# Patient Record
Sex: Female | Born: 1995
Health system: Southern US, Community
[De-identification: ages and names within clinical notes are randomized; demographics above are authoritative.]

## PROBLEM LIST (undated history)

## (undated) DIAGNOSIS — F32A Depression, unspecified: Secondary | ICD-10-CM

## (undated) DIAGNOSIS — T7840XA Allergy, unspecified, initial encounter: Secondary | ICD-10-CM

## (undated) DIAGNOSIS — F419 Anxiety disorder, unspecified: Secondary | ICD-10-CM

## (undated) HISTORY — DX: Depression, unspecified: F32.A

## (undated) HISTORY — DX: Anxiety disorder, unspecified: F41.9

## (undated) HISTORY — DX: Allergy, unspecified, initial encounter: T78.40XA

---
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
## 2020-12-03 DIAGNOSIS — J0391 Acute recurrent tonsillitis, unspecified: Secondary | ICD-10-CM | POA: Insufficient documentation

## 2021-09-22 ENCOUNTER — Encounter: Payer: Self-pay | Admitting: Physician Assistant

## 2021-09-22 ENCOUNTER — Ambulatory Visit: Payer: BC Managed Care – PPO | Admitting: Physician Assistant

## 2021-09-22 ENCOUNTER — Telehealth: Payer: Self-pay | Admitting: *Deleted

## 2021-09-22 VITALS — BP 110/80 | HR 70 | Temp 98.3°F | Ht 66.0 in | Wt 249.4 lb

## 2021-09-22 DIAGNOSIS — Z124 Encounter for screening for malignant neoplasm of cervix: Secondary | ICD-10-CM

## 2021-09-22 DIAGNOSIS — F411 Generalized anxiety disorder: Secondary | ICD-10-CM | POA: Diagnosis not present

## 2021-09-22 DIAGNOSIS — R079 Chest pain, unspecified: Secondary | ICD-10-CM

## 2021-09-22 DIAGNOSIS — Z309 Encounter for contraceptive management, unspecified: Secondary | ICD-10-CM | POA: Diagnosis not present

## 2021-09-22 DIAGNOSIS — E559 Vitamin D deficiency, unspecified: Secondary | ICD-10-CM | POA: Diagnosis not present

## 2021-09-22 DIAGNOSIS — F320 Major depressive disorder, single episode, mild: Secondary | ICD-10-CM | POA: Diagnosis not present

## 2021-09-22 LAB — CBC WITH DIFFERENTIAL/PLATELET
Basophils Absolute: 0 10*3/uL (ref 0.0–0.1)
Basophils Relative: 0.6 % (ref 0.0–3.0)
Eosinophils Absolute: 0.1 10*3/uL (ref 0.0–0.7)
Eosinophils Relative: 2.6 % (ref 0.0–5.0)
HCT: 37.9 % (ref 36.0–46.0)
Hemoglobin: 12.2 g/dL (ref 12.0–15.0)
Lymphocytes Relative: 37.7 % (ref 12.0–46.0)
Lymphs Abs: 2.1 10*3/uL (ref 0.7–4.0)
MCHC: 32.2 g/dL (ref 30.0–36.0)
MCV: 73.3 fl — ABNORMAL LOW (ref 78.0–100.0)
Monocytes Absolute: 0.3 10*3/uL (ref 0.1–1.0)
Monocytes Relative: 5.9 % (ref 3.0–12.0)
Neutro Abs: 3 10*3/uL (ref 1.4–7.7)
Neutrophils Relative %: 53.2 % (ref 43.0–77.0)
Platelets: 195 10*3/uL (ref 150.0–400.0)
RBC: 5.17 Mil/uL — ABNORMAL HIGH (ref 3.87–5.11)
RDW: 15.5 % (ref 11.5–15.5)
WBC: 5.6 10*3/uL (ref 4.0–10.5)

## 2021-09-22 LAB — COMPREHENSIVE METABOLIC PANEL
ALT: 18 U/L (ref 0–35)
AST: 16 U/L (ref 0–37)
Albumin: 4.3 g/dL (ref 3.5–5.2)
Alkaline Phosphatase: 68 U/L (ref 39–117)
BUN: 13 mg/dL (ref 6–23)
CO2: 27 mEq/L (ref 19–32)
Calcium: 9.3 mg/dL (ref 8.4–10.5)
Chloride: 105 mEq/L (ref 96–112)
Creatinine, Ser: 0.84 mg/dL (ref 0.40–1.20)
GFR: 97.96 mL/min (ref >60.00)
Glucose, Bld: 89 mg/dL (ref 70–99)
Potassium: 4.3 mEq/L (ref 3.5–5.1)
Sodium: 140 mEq/L (ref 135–145)
Total Bilirubin: 0.5 mg/dL (ref 0.2–1.2)
Total Protein: 7.3 g/dL (ref 6.0–8.3)

## 2021-09-22 LAB — TSH: TSH: 1.85 u[IU]/mL (ref 0.35–5.50)

## 2021-09-22 LAB — VITAMIN D 25 HYDROXY (VIT D DEFICIENCY, FRACTURES): VITD: 22.77 ng/mL — ABNORMAL LOW (ref 30.00–100.00)

## 2021-09-22 MED ORDER — ESCITALOPRAM OXALATE 10 MG PO TABS: 10.0000 mg | ORAL_TABLET | Freq: Every day | ORAL | 1 refills | Status: DC

## 2021-09-22 MED ORDER — NORGESTIM-ETH ESTRAD TRIPHASIC 0.18/0.215/0.25 MG-35 MCG PO TABS: 1.0000 | ORAL_TABLET | Freq: Every day | ORAL | 1 refills | Status: DC

## 2021-09-22 NOTE — Telephone Encounter (Signed)
Pt called back told her Rx's were sent to her pharmacy for her and referral has been placed for Gyn, someone will contact you to schedule an appt. ?

## 2021-09-22 NOTE — Patient Instructions (Signed)
It was great to see you! ? ?Start lexapro 10 mg daily ? ?Gynecology referral placed ? ?Start Tri-Sprintec birth control pills ? ?We will update blood work today ? ?If chest pain does not improve or worsens in any way, please let us know ? ?Take care, ? ?Jarold Motto PA-C  ?

## 2021-09-22 NOTE — Telephone Encounter (Signed)
Left message on voicemail to call office.  

## 2021-09-22 NOTE — Progress Notes (Signed)
Yvonne Odom is a 26 y.o. female here for a follow up of a pre-existing problem. ? ?History of Present Illness:  ? ?Chief Complaint  ?Patient presents with  ? Anxiety  ? Depression  ?  Pt c/o increase in anxiety for the past 3 months. She was Zoloft but stopped a year ago.  ? Contraception  ?  Pt would like to discuss birth control options.  ? ? ?HPI ? ?Anxiety ?Patient here with worsening anxiety. Symptoms started to get worse for the past 3 months. She currently moved from [place] to [place] about 2 months ago. 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 She is getting married in few months and thinks this could be contributing. She notes she was taking Zoloft for 2 years in the past. This has not helped. She started to get more anxious so she stopped taking Zoloft about a year ago. She was on Zoloft 100 mg in the past. She was tolerating her medication well. No other side effects.  ? ?She notes that she has been having persistent chest pain. She thinks this is related to her anxiety. She described this as "pressure and dull" pain. Comes and goes. Usually last about couple of minutes. She has participated in talk therapy for few months. Her mood has been better. However, her anxiety seems to be worsening. She has been having hard time sleeping at night. She has never tried any other medication other than Zoloft for her anxiety. She would like to try alternative for her symptoms. No SI/HI, LE swelling, n/v/d, diaphoresis. ? ?Birth Control ?Patient present with desire to be started on birth control. She is getting married in few months and would like to have no kids at this time. She notes her periods are normal. However, she has had worsening cramps. Her period are usually heavier during the months. She has noticed some changes in her cycle. She notes her period are usually early sometimes and lighter. Denies nausea or vomiting or prior hx of bleeding/clotting issues. Denies any mood swings.   ? ?Vit D deficiency ?Hx of this, takes supplement regularly. Would like this checked today. ? ?Past Medical History:  ?Diagnosis Date  ? Allergy   ? Anxiety   ? Depression   ? ?  ?Social History  ? ?Tobacco Use  ? Smoking status: Never  ? Smokeless tobacco: Never  ?Vaping Use  ? Vaping Use: Former  ? Quit date: 05/22/2020  ?Substance Use Topics  ? Alcohol use: Yes  ?  Alcohol/week: 2.0 - 3.0 standard drinks  ?  Types: 2 - 3 Glasses of wine per week  ? Drug use: Never  ? ? ?History reviewed. No pertinent surgical history. ? ?Family History  ?Problem Relation Age of Onset  ? Alcohol abuse Father   ? Depression Sister   ? Depression Sister   ? Hyperlipidemia Maternal Grandmother   ? Diabetes Maternal Grandmother   ? Hypertension Maternal Grandfather   ? Hypertension Paternal Grandmother   ? Diabetes Paternal Grandmother   ? Kidney disease Paternal Grandmother   ? Breast cancer Neg Hx   ? Colon cancer Neg Hx   ? ? ?No Known Allergies ? ?Current Medications:  ? ?Current Outpatient Medications:  ?  Cholecalciferol (VITAMIN D) 125 MCG (5000 UT) CAPS, Take 1 capsule by mouth daily in the afternoon., Disp: , Rfl:  ?  escitalopram (LEXAPRO) 10 MG tablet, Take 1 tablet (10 mg total) by mouth daily., Disp: 30 tablet, Rfl: 1 ?  Multiple Vitamins-Minerals (MULTI-VITAMIN GUMMIES) CHEW, Chew 2 each by mouth daily in the afternoon., Disp: , Rfl:  ?  Norgestimate-Ethinyl Estradiol Triphasic (TRI-SPRINTEC) 0.18/0.215/0.25 MG-35 MCG tablet, Take 1 tablet by mouth daily., Disp: 84 tablet, Rfl: 1  ? ?Review of Systems:  ? ?ROS ?Negative unless otherwise specified per HPI.  ? ?Vitals:  ? ?Vitals:  ? 09/22/21 0811  ?BP: 110/80  ?Pulse: 70  ?Temp: 98.3 ?F (36.8 ?C)  ?TempSrc: Temporal  ?SpO2: 97%  ?Weight: 249 lb 6.1 oz (113.1 kg)  ?Height: 5\' 6"  (1.676 m)  ?   ?Body mass index is 40.25 kg/m?. ? ?Physical Exam:  ? ?Physical Exam ?Vitals and nursing note reviewed.  ?Constitutional:   ?   General: She is not in acute distress. ?   Appearance:  She is well-developed. She is not ill-appearing or toxic-appearing.  ?Cardiovascular:  ?   Rate and Rhythm: Normal rate and regular rhythm.  ?   Pulses: Normal pulses.  ?   Heart sounds: Normal heart sounds, S1 normal and S2 normal.  ?Pulmonary:  ?   Effort: Pulmonary effort is normal.  ?   Breath sounds: Normal breath sounds.  ?Skin: ?   General: Skin is warm and dry.  ?Neurological:  ?   Mental Status: She is alert.  ?   GCS: GCS eye subscore is 4. GCS verbal subscore is 5. GCS motor subscore is 6.  ?Psychiatric:     ?   Speech: Speech normal.     ?   Behavior: Behavior normal. Behavior is cooperative.  ? ? ?Assessment and Plan:  ? ?GAD (generalized anxiety disorder); Depression, major, single episode, mild (HCC) ?Uncontrolled ?Denies SI/HI ?Start lexapro 10 mg daily ?Follow-up in 1 month, sooner if concerns ?I discussed with patient that if they develop any SI, to tell someone immediately and seek medical attention. ? ?Encounter for contraceptive management, unspecified type ?Options discussed ?She would like to trial OCP ?Start tri-sprintec -- discussed side effects/risks/benefits ?She is currently not sexually active ?Follow-up with gyn -- referral placed ? ?Vitamin D deficiency ?Update Vit D and provide recommendations accordingly ? ?Pap smear for cervical cancer screening ?Gyn referral ? ?Chest pain, unspecified type ?New ?Offered EKG but she declined ?Does not sound cardiac in nature, she would like to wait to see if this recurs that we further work-up ?Continue to monitor -- if new sx or any worsening, needs to go to ER ? ?I,Savera Zaman,acting as a for Neurosurgeon, PA.,have documented all relevant documentation on the behalf of [employer], PA,as directed by  Jarold Motto, PA while in the presence of Jarold Motto, Jarold Motto.  ? ?[place], PA, have reviewed all documentation for this visit. The documentation on 09/22/21 for the exam, diagnosis, procedures, and orders are all accurate  and complete. ? ? ?11/22/21, PA-C ? ?

## 2021-09-23 ENCOUNTER — Other Ambulatory Visit: Payer: Self-pay | Admitting: Physician Assistant

## 2021-09-23 MED ORDER — VITAMIN D (ERGOCALCIFEROL) 1.25 MG (50000 UNIT) PO CAPS: 50000.0000 [IU] | ORAL_CAPSULE | ORAL | 0 refills | Status: DC

## 2021-11-25 ENCOUNTER — Encounter: Payer: Self-pay | Admitting: Physician Assistant

## 2021-11-25 ENCOUNTER — Ambulatory Visit: Payer: BC Managed Care – PPO | Admitting: Physician Assistant

## 2021-11-25 VITALS — BP 110/80 | HR 62 | Temp 98.0°F | Ht 66.0 in | Wt 253.0 lb

## 2021-11-25 DIAGNOSIS — F411 Generalized anxiety disorder: Secondary | ICD-10-CM | POA: Diagnosis not present

## 2021-11-25 DIAGNOSIS — F320 Major depressive disorder, single episode, mild: Secondary | ICD-10-CM | POA: Diagnosis not present

## 2021-11-25 DIAGNOSIS — Z309 Encounter for contraceptive management, unspecified: Secondary | ICD-10-CM | POA: Diagnosis not present

## 2021-11-25 MED ORDER — ESCITALOPRAM OXALATE 20 MG PO TABS: 20.0000 mg | ORAL_TABLET | Freq: Every day | ORAL | 3 refills | Status: DC

## 2021-11-25 NOTE — Progress Notes (Signed)
Yvonne Odom is a 26 y.o. female here for a follow up on anxiety.   History of Present Illness:   Chief Complaint  Patient presents with   Anxiety    HPI  Anxiety/Depression  Patient here for 1 month follow-up. She is currently taking Lexapro 10 mg daily with no complications. She is compliant with her medication. Has had some nausea but she also started her birth control at this time. Tolerating her medication with no other adverse side effects. Has had increased anxiety in the previous visit but this has improved. Per pt, chest pain has been improving as well. She has found this regimen to be beneficial for her but feels like she needs higher dose. Denies any worsening anxiety sx. No SI/HI.   Contraception management She is currently taking tri-sprintec with no adverse side effects. At this time, she is managing well. Denies any concerning sx.   Past Medical History:  Diagnosis Date   Allergy    Anxiety    Depression      Social History   Tobacco Use   Smoking status: Never   Smokeless tobacco: Never  Vaping Use   Vaping Use: Former   Quit date: 05/22/2020  Substance Use Topics   Alcohol use: Yes    Alcohol/week: 2.0 - 3.0 standard drinks of alcohol    Types: 2 - 3 Glasses of wine per week   Drug use: Never    History reviewed. No pertinent surgical history.  Family History  Problem Relation Age of Onset   Alcohol abuse Father    Depression Sister    Depression Sister    Hyperlipidemia Maternal Grandmother    Diabetes Maternal Grandmother    Hypertension Maternal Grandfather    Hypertension Paternal Grandmother    Diabetes Paternal Grandmother    Kidney disease Paternal Grandmother    Breast cancer Neg Hx    Colon cancer Neg Hx     No Known Allergies  Current Medications:   Current Outpatient Medications:    Cholecalciferol (VITAMIN D) 125 MCG (5000 UT) CAPS, Take 1 capsule by mouth daily in the afternoon., Disp: , Rfl:    escitalopram (LEXAPRO) 10  MG tablet, Take 1 tablet (10 mg total) by mouth daily., Disp: 30 tablet, Rfl: 1   Multiple Vitamins-Minerals (MULTI-VITAMIN GUMMIES) CHEW, Chew 2 each by mouth daily in the afternoon., Disp: , Rfl:    Norgestimate-Ethinyl Estradiol Triphasic (TRI-SPRINTEC) 0.18/0.215/0.25 MG-35 MCG tablet, Take 1 tablet by mouth daily., Disp: 84 tablet, Rfl: 1   Vitamin D, Ergocalciferol, (DRISDOL) 1.25 MG (50000 UNIT) CAPS capsule, Take 1 capsule (50,000 Units total) by mouth every 7 (seven) days., Disp: 12 capsule, Rfl: 0   Review of Systems:   ROS Negative unless otherwise specified per HPI.   Vitals:   Vitals:   11/25/21 1007  BP: 110/80  Pulse: 62  Temp: 98 F (36.7 C)  TempSrc: Temporal  SpO2: 98%  Weight: 253 lb (114.8 kg)  Height: 5\' 6"  (1.676 m)     Body mass index is 40.84 kg/m.  Physical Exam:   Physical Exam Vitals and nursing note reviewed.  Constitutional:      General: She is not in acute distress.    Appearance: She is well-developed. She is not ill-appearing or toxic-appearing.  Cardiovascular:     Rate and Rhythm: Normal rate and regular rhythm.     Pulses: Normal pulses.     Heart sounds: Normal heart sounds, S1 normal and S2 normal.  Pulmonary:  Effort: Pulmonary effort is normal.     Breath sounds: Normal breath sounds.  Skin:    General: Skin is warm and dry.  Neurological:     Mental Status: She is alert.     GCS: GCS eye subscore is 4. GCS verbal subscore is 5. GCS motor subscore is 6.  Psychiatric:        Speech: Speech normal.        Behavior: Behavior normal. Behavior is cooperative.     Assessment and Plan:   GAD (generalized anxiety disorder); Depression, major, single episode, mild (HCC) Improving Increase Lexapro to 20 mg daily Follow-up in 6 months for CPE, sooner if concerns  Encounter for contraceptive management, unspecified type Doing well with Tri-Sprintec -- denies any significant concerns Continue medications at this time Follow-up  in 6 months for CPE, sooner if concerns  I,Savera Zaman,acting as a scribe for [employer], PA.,have documented all relevant documentation on the behalf of Jarold Motto, PA,as directed by  Jarold Motto, PA while in the presence of Jarold Motto, [place].   I, Jarold Motto, [place], have reviewed all documentation for this visit. The documentation on 11/25/21 for the exam, diagnosis, procedures, and orders are all accurate and complete.   Jarold Motto, PA-C

## 2021-11-25 NOTE — Patient Instructions (Signed)
Follow up in 6 months 

## 2021-12-06 ENCOUNTER — Encounter: Payer: Self-pay | Admitting: Nurse Practitioner

## 2021-12-06 ENCOUNTER — Other Ambulatory Visit (HOSPITAL_COMMUNITY)
Admission: RE | Admit: 2021-12-06 | Discharge: 2021-12-06 | Disposition: A | Discharge status: Home / Self Care | Payer: BC Managed Care – PPO | Source: Ambulatory Visit | Attending: Nurse Practitioner | Admitting: Nurse Practitioner

## 2021-12-06 ENCOUNTER — Ambulatory Visit (INDEPENDENT_AMBULATORY_CARE_PROVIDER_SITE_OTHER): Payer: BC Managed Care – PPO | Admitting: Nurse Practitioner

## 2021-12-06 VITALS — BP 130/86 | HR 66 | Resp 14 | Ht 66.0 in | Wt 255.0 lb

## 2021-12-06 DIAGNOSIS — Z01419 Encounter for gynecological examination (general) (routine) without abnormal findings: Secondary | ICD-10-CM | POA: Insufficient documentation

## 2021-12-06 DIAGNOSIS — Z3041 Encounter for surveillance of contraceptive pills: Secondary | ICD-10-CM

## 2021-12-06 NOTE — Progress Notes (Signed)
   Yvonne Odom 1996/01/16 [id number]   History:  26 y.o. G0 presents as new patient to establish care. Monthly cycles. On COCs. Anxiety, depression managed by PCP. Thinks she had Gardasil series.   Gynecologic History Patient's last menstrual period was 11/14/2021 (exact date). Period Cycle (Days): 28 Period Duration (Days): 3-4 Period Pattern: Regular Menstrual Flow: Moderate Menstrual Control: Tampon Dysmenorrhea: (!) Mild Dysmenorrhea Symptoms: Cramping, Other (Comment) (back pain) Contraception/Family planning: OCP (estrogen/progesterone) Sexually active: Yes  Health Maintenance Last Pap: Never Last mammogram: Not indicated Last colonoscopy: Not indicated Last Dexa: Not indicated  Past medical history, past surgical history, family history and social history were all reviewed and documented in the EPIC chart. Married one month ago. Just finished masters. 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   ROS:  A ROS was performed and pertinent positives and negatives are included.  Exam:  Vitals:   12/06/21 1439  BP: 130/86  Pulse: 66  Resp: 14  Weight: 255 lb (115.7 kg)  Height: 5\' 6"  (1.676 m)   Body mass index is 41.16 kg/m.  General appearance:  Normal Thyroid:  Symmetrical, normal in size, without palpable masses or nodularity. Respiratory  Auscultation:  Clear without wheezing or rhonchi Cardiovascular  Auscultation:  Regular rate, without rubs, murmurs or gallops  Edema/varicosities:  Not grossly evident Abdominal  Soft,nontender, without masses, guarding or rebound.  Liver/spleen:  No organomegaly noted  Hernia:  None appreciated  Skin  Inspection:  Grossly normal Breasts: Not indicated per guidelines Genitourinary   Inguinal/mons:  Normal without inguinal adenopathy  External genitalia:  Normal appearing vulva with no masses, tenderness, or lesions  BUS/Urethra/Skene's glands:  Normal  Vagina:  Normal appearing with normal color and  discharge, no lesions  Cervix:  Normal appearing without discharge or lesions  Uterus:  Normal in size, shape and contour.  Midline and mobile, nontender  Adnexa/parametria:     Rt: Normal in size, without masses or tenderness.   Lt: Normal in size, without masses or tenderness.  Anus and perineum: Normal  Digital rectal exam: Not indicated  Patient informed chaperone available to be present for breast and pelvic exam. Patient has requested no chaperone to be present. Patient has been advised what will be completed during breast and pelvic exam.   Assessment/Plan:  26 y.o. G0 to establish care.   Well female exam with routine gynecological exam - Plan: Cytology - PAP( [facility]). Education provided on SBEs, importance of preventative screenings, current guidelines, high calcium diet, regular exercise, and multivitamin daily. Has PCP.   Encounter for surveillance of contraceptive pills - COCs. Doing well on these and taking as prescribed. Provided by PCP.   Screening for cervical cancer - Initial pap today.   Return in 1 year for annual.     30 DNP, 3:10 PM 12/06/2021

## 2021-12-08 LAB — CYTOLOGY - PAP

## 2022-01-04 ENCOUNTER — Encounter: Payer: Self-pay | Admitting: Physician Assistant

## 2022-01-04 ENCOUNTER — Ambulatory Visit (INDEPENDENT_AMBULATORY_CARE_PROVIDER_SITE_OTHER): Payer: BC Managed Care – PPO | Admitting: Physician Assistant

## 2022-01-04 VITALS — BP 110/70 | HR 72 | Temp 97.7°F | Ht 66.0 in | Wt 255.0 lb

## 2022-01-04 DIAGNOSIS — L304 Erythema intertrigo: Secondary | ICD-10-CM

## 2022-01-04 DIAGNOSIS — R110 Nausea: Secondary | ICD-10-CM

## 2022-01-04 DIAGNOSIS — G43009 Migraine without aura, not intractable, without status migrainosus: Secondary | ICD-10-CM

## 2022-01-04 DIAGNOSIS — F411 Generalized anxiety disorder: Secondary | ICD-10-CM

## 2022-01-04 DIAGNOSIS — G4700 Insomnia, unspecified: Secondary | ICD-10-CM

## 2022-01-04 DIAGNOSIS — R5383 Other fatigue: Secondary | ICD-10-CM | POA: Diagnosis not present

## 2022-01-04 MED ORDER — ESCITALOPRAM OXALATE 10 MG PO TABS: 15.0000 mg | ORAL_TABLET | Freq: Every day | ORAL | 3 refills | Status: DC

## 2022-01-04 MED ORDER — NYSTATIN 100000 UNIT/GM EX OINT: TOPICAL_OINTMENT | CUTANEOUS | 0 refills | Status: DC

## 2022-01-04 MED ORDER — RIZATRIPTAN BENZOATE 5 MG PO TABS: 5.0000 mg | ORAL_TABLET | ORAL | 0 refills | Status: DC | PRN

## 2022-01-04 MED ORDER — ONDANSETRON HCL 4 MG PO TABS: 4.0000 mg | ORAL_TABLET | Freq: Three times a day (TID) | ORAL | 1 refills | Status: DC | PRN

## 2022-01-04 NOTE — Patient Instructions (Addendum)
It was great to see you! Let's follow-up in 3 months to check in on all of these topics  Stop birth control. Message me if nausea does not improve with this. I have sent in zofran for nausea.  Ask your partner if they feel as though you have sleep apnea -- severe snoring or waking up gasping for air.  Start 5 mg maxalt to use as needed for your migraines -- see below.   Cream for breast rash.   Decrease lexapro to 15 mg daily.  Work on sleep hygiene - see below  Migraine Recommendations: 1.  Start maxalt as needed.  Call in 4 weeks with update and we can adjust dose if needed. 2.  Take maxalt at earliest onset of headache.  May repeat dose once in 2 hours if needed.  Do not exceed two tablets in 24 hours. 3.  Limit use of pain relievers to no more than 2 days out of the week.  These medications include acetaminophen, ibuprofen, triptans and narcotics.  This will help reduce risk of rebound headaches. 4.  Be aware of common food triggers such as processed sweets, processed foods with nitrites (such as deli meat, hot dogs, sausages), foods with MSG, alcohol (such as wine), chocolate, certain cheeses, certain fruits (dried fruits, bananas, pineapple), vinegar, diet soda. 4.  Avoid caffeine 5.  Routine exercise 6.  Proper sleep hygiene 7.  Stay adequately hydrated with water 8.  Keep a headache diary. 9.  Maintain proper stress management. 10.  Do not skip meals. 11.  Consider supplements:  Magnesium citrate 400mg  to 600mg  daily, riboflavin 400mg , Coenzyme Q 10 100mg  three times daily  Sleep Hygiene  Do: (1) Go to bed at the same time each day. (2) Get up from bed at the same time each day. (3) Get regular exercise each day, preferably in the morning.  There is goof evidence that regular exercise improves restful sleep.  This includes stretching and aerobic exercise. (4) Get regular exposure to outdoor or bright lights, especially in the late afternoon. (5) Keep the temperature in your  bedroom comfortable. (6) Keep the bedroom quiet when sleeping. (7) Keep the bedroom dark enough to facilitate sleep. (8) Use your bed only for sleep and sex. (9) Take medications as directed.  It is helpful to take prescribed sleeping pills 1 hour before bedtime, so they are causing drowsiness when you lie down, or 10 hours before getting up, to avoid daytime drowsiness. (10) Use a relaxation exercise just before going to sleep -- imagery, massage, warm bath. (11) Keep your feet and hands warm.  Wear warm socks and/or mittens or gloves to bed.  Don't: (1) Exercise just before going to bed. (2) Engage in stimulating activity just before bed, such as playing a competitive game, watching an exciting program on television, or having an important discussion with a loved one. (3) Have caffeine in the evening (coffee, teas, chocolate, sodas, etc.) (4) Read or watch television in bed. (5) Use alcohol to help you sleep. (6) Go to bed too hungry or too full. (7) Take another person's sleeping pills. (8) Take over-the-counter sleeping pills, without your doctor's knowledge.  Tolerance can develop rapidly with these medications.  Diphenhydramine can have serious side effects for elderly patients. (9) Take daytime naps. (10) Command yourself to go to sleep.  This only makes your mind and body more alert.  If you lie awake for more than 20-30 minutes, get up, go to a different room, participate in  a quiet activity (Ex - non-excitable reading or television), and then return to bed when you feel sleepy.  Do this as many times during the night as needed.  This may cause you to have a night or two of poor sleep but it will train your brain to know when it is time for sleep.

## 2022-01-04 NOTE — Progress Notes (Signed)
Yvonne Odom is a 26 y.o. female here for a follow up of a pre-existing problem.  History of Present Illness:   Chief Complaint  Patient presents with   Migraine    Pt c/o Migraines have been worse past couple of months, last migraine was 2 days ago. Migraine are usually on the left side. Also c/o left eye twitching off and on x 2 months. Takes Ibuprofen and Tylenol.    HPI  Migraines Has had migraines since a young child. They were overall well controlled up until a few months ago - they are currently occurring weekly or every other week. Takes 3-4 ibuprofen at at a time for her migraines. This is sometimes effective for her. Would like possible abortive medication for this.  She has never been on an abortive prescription.  She is concerned that her migraines could be worsened because of her birth control.  She denies any visual or sensory auras at this time.  Denies any numbness, tingling, worst headache of life, vision changes.  Nausea Intermittent x 1 year. No vomiting. Will typically last a few hours. Has never taken anything for this in the past.  Occurs most days. Thinks that maybe her birth control is contributing to this.  Fatigue; insomnia She is dealing with this all the time.  She states that she is not sleeping well and thinks that this could be the culprit.  She has no issues falling asleep but she is waking up sometimes 2-5 times in the middle of the night.  This morning she woke up at 3 and was up for the day.  When she wakes up in the middle the night she typically looks at her phone.  She does not suspect that she has sleep apnea.  She is stressed with starting up school soon.  Anxiety She feels like maybe she needs to decrease her Lexapro.  She states that the 10 mg of Lexapro was not effective so she is interested in trialing 15 mg.  She feels like she is a little bit keyed up with the current dose of Lexapro.  Denies SI/HI.  Rash Under bilateral breasts and near  sternum she has what she feels is a heat rash.  Can be itchy and painful at times.  She has not put anything on it.  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  Area has not opened or drained.   Past Medical History:  Diagnosis Date   Allergy    Anxiety    Depression      Social History   Tobacco Use   Smoking status: Never    Passive exposure: Never   Smokeless tobacco: Never  Vaping Use   Vaping Use: Former   Quit date: 05/22/2020  Substance Use Topics   Alcohol use: Yes    Alcohol/week: 2.0 - 3.0 standard drinks of alcohol    Types: 2 - 3 Glasses of wine per week   Drug use: Never    History reviewed. No pertinent surgical history.  Family History  Problem Relation Age of Onset   Alcohol abuse Father    Depression Sister    Depression Sister    Hyperlipidemia Maternal Grandmother    Diabetes Maternal Grandmother    Hypertension Maternal Grandfather    Hypertension Paternal Grandmother    Diabetes Paternal Grandmother    Kidney disease Paternal Grandmother    Breast cancer Neg Hx  Colon cancer Neg Hx     No Known Allergies  Current Medications:   Current Outpatient Medications:    acetaminophen (TYLENOL) 500 MG tablet, Take 1,000 mg by mouth every 6 (six) hours as needed for headache., Disp: , Rfl:    Cholecalciferol (VITAMIN D) 125 MCG (5000 UT) CAPS, Take 1 capsule by mouth daily in the afternoon., Disp: , Rfl:    escitalopram (LEXAPRO) 10 MG tablet, Take 1.5 tablets (15 mg total) by mouth daily., Disp: 45 tablet, Rfl: 3   ibuprofen (ADVIL) 200 MG tablet, Take 600-800 mg by mouth every 6 (six) hours as needed for headache., Disp: , Rfl:    Multiple Vitamins-Minerals (MULTI-VITAMIN GUMMIES) CHEW, Chew 2 each by mouth daily in the afternoon., Disp: , Rfl:    Norgestimate-Ethinyl Estradiol Triphasic (TRI-SPRINTEC) 0.18/0.215/0.25 MG-35 MCG tablet, Take 1 tablet by mouth daily., Disp: 84 tablet, Rfl: 1    nystatin ointment (MYCOSTATIN), Apply to affected area 1-2 times daily, Disp: 30 g, Rfl: 0   ondansetron (ZOFRAN) 4 MG tablet, Take 1 tablet (4 mg total) by mouth every 8 (eight) hours as needed for nausea or vomiting., Disp: 30 tablet, Rfl: 1   rizatriptan (MAXALT) 5 MG tablet, Take 1 tablet (5 mg total) by mouth as needed for migraine. May repeat in 2 hours if needed, Disp: 10 tablet, Rfl: 0   Review of Systems:   ROS Negative unless otherwise specified per HPI.   Vitals:   Vitals:   01/04/22 1309  BP: 110/70  Pulse: 72  Temp: 97.7 F (36.5 C)  TempSrc: Temporal  SpO2: 98%  Weight: 255 lb (115.7 kg)  Height: 5\' 6"  (1.676 m)     Body mass index is 41.16 kg/m.  Physical Exam:   Physical Exam Vitals and nursing note reviewed.  Constitutional:      General: She is not in acute distress.    Appearance: She is well-developed. She is not ill-appearing or toxic-appearing.  Cardiovascular:     Rate and Rhythm: Normal rate and regular rhythm.     Pulses: Normal pulses.     Heart sounds: Normal heart sounds, S1 normal and S2 normal.  Pulmonary:     Effort: Pulmonary effort is normal.     Breath sounds: Normal breath sounds.  Skin:    General: Skin is warm and dry.  Neurological:     Mental Status: She is alert.     GCS: GCS eye subscore is 4. GCS verbal subscore is 5. GCS motor subscore is 6.  Psychiatric:        Speech: Speech normal.        Behavior: Behavior normal. Behavior is cooperative.     Assessment and Plan:   Migraine without aura and without status migrainosus, not intractable No red flags Stop birth control to see if this is contributing  I have also given her Maxalt 5 mg to use for abortive measures I have given her a list of supplements to consider taking Follow-up in 3 months, sooner if concerns  Nausea Unclear etiology We will stop birth control to see if this is contributing I have sent in Zofran to see if this can help her symptoms Follow-up  in 3 months, sooner if concerns  Other fatigue; Insomnia, unspecified type Suspect that this may be related to her recent increase in anxiety as well as poor sleep habits Decrease Lexapro to help mitigate possible side effects that are keeping her awake Discussed sleep hygiene Asked her to ask her  spouse to see if they have concerns for sleep apnea, consider sleep study.  Intertrigo Suspect intertrigo Nystatin provided to apply to area Follow-up if not improving  GAD (generalized anxiety disorder) Uncontrolled Recommend decrease Lexapro to 15 mg daily Follow-up in 3 months, sooner if concerns  Time spent with patient today was 55 minutes which consisted of chart review, discussing diagnosis, work up, treatment answering questions and documentation.   Jarold Motto, PA-C

## 2022-01-13 ENCOUNTER — Ambulatory Visit: Payer: BC Managed Care – PPO | Admitting: Physician Assistant

## 2022-01-22 ENCOUNTER — Telehealth: Payer: BC Managed Care – PPO | Admitting: Nurse Practitioner

## 2022-01-22 DIAGNOSIS — J069 Acute upper respiratory infection, unspecified: Secondary | ICD-10-CM

## 2022-01-22 MED ORDER — FLUTICASONE PROPIONATE 50 MCG/ACT NA SUSP: 2.0000 | Freq: Every day | NASAL | 6 refills | Status: DC

## 2022-01-22 MED ORDER — BENZONATATE 100 MG PO CAPS: 100.0000 mg | ORAL_CAPSULE | Freq: Three times a day (TID) | ORAL | 0 refills | Status: DC | PRN

## 2022-01-22 NOTE — Progress Notes (Signed)
E-Visit for Upper Respiratory Infection   We are sorry you are not feeling well.  Here is how we plan to help!  After reviewing your history and your images it looks like you have a virus. One virus that can cause a sore throat and a cough is COVID, you may want to consider taking a home COVID test.   Another Virus that causes a sore throat with white patches similar to the ones you have is Mono. If you have not had mono in the past and your sore throat persists you may want to consider getting tested for mono. This virus typically causes a sore throat, swollen lymph nodes in the neck and fatigue.  Upper Respiratory Tract Infections: Symptoms vary from person to person, with common symptoms including sore throat, cough, fatigue or lack of energy and feeling of general discomfort.  A low-grade fever of up to 100.4 may present, but is often uncommon.  Symptoms vary however, and are closely related to a person's age or underlying illnesses.  The most common symptoms associated with an upper respiratory infection are nasal discharge or congestion, cough, sneezing, headache and pressure in the ears and face.  These symptoms usually persist for about 3 to 10 days, but can last up to 2 weeks.  It is important to know that upper respiratory infections do not cause serious illness or complications in most cases.    Upper respiratory infections can be transmitted from person to person, with the most common method of transmission being a person's hands.  The virus is able to live on the skin and can infect other persons for up to 2 hours after direct contact.  Also, these can be transmitted when someone coughs or sneezes; thus, it is important to cover the mouth to reduce this risk.  To keep the spread of the illness at bay, good hand hygiene is very important.  This is an infection that is most likely caused by a virus. There are no specific treatments other than to help you with the symptoms until the infection  runs its course.  We are sorry you are not feeling well.  Here is how we plan to help!   For nasal congestion, you may use an oral decongestants such as Mucinex D or if you have glaucoma or high blood pressure use plain Mucinex.  Saline nasal spray or nasal drops can help and can safely be used as often as needed for congestion.  For your congestion, I have prescribed Fluticasone nasal spray one spray in each nostril twice a day  If you do not have a history of heart disease, hypertension, diabetes or thyroid disease, prostate/bladder issues or glaucoma, you may also use Sudafed to treat nasal congestion.  It is highly recommended that you consult with a pharmacist or your primary care physician to ensure this medication is safe for you to take.     If you have a cough, you may use cough suppressants such as Delsym and Robitussin.  If you have glaucoma or high blood pressure, you can also use Coricidin HBP.   For cough I have prescribed for you A prescription cough medication called Tessalon Perles 100 mg. You may take 1-2 capsules every 8 hours as needed for cough  If you have a sore or scratchy throat, use a saltwater gargle-  to  teaspoon of salt dissolved in a 4-ounce to 8-ounce glass of warm water.  Gargle the solution for approximately 15-30 seconds and then spit.  It is important not to swallow the solution.  You can also use throat lozenges/cough drops and Chloraseptic spray to help with throat pain or discomfort.  Warm or cold liquids can also be helpful in relieving throat pain.  For headache, pain or general discomfort, you can use Ibuprofen or Tylenol as directed.   Some authorities believe that zinc sprays or the use of Echinacea may shorten the course of your symptoms.   HOME CARE Only take medications as instructed by your medical team. Be sure to drink plenty of fluids. Water is fine as well as fruit juices, sodas and electrolyte beverages. You may want to stay away from caffeine  or alcohol. If you are nauseated, try taking small sips of liquids. How do you know if you are getting enough fluid? Your urine should be a pale yellow or almost colorless. Get rest. Taking a steamy shower or using a humidifier may help nasal congestion and ease sore throat pain. You can place a towel over your head and breathe in the steam from hot water coming from a faucet. Using a saline nasal spray works much the same way. Cough drops, hard candies and sore throat lozenges may ease your cough. Avoid close contacts especially the very young and the elderly Cover your mouth if you cough or sneeze Always remember to wash your hands.   GET HELP RIGHT AWAY IF: You develop worsening fever. If your symptoms do not improve within 10 days You develop yellow or green discharge from your nose over 3 days. You have coughing fits You develop a severe head ache or visual changes. You develop shortness of breath, difficulty breathing or start having chest pain Your symptoms persist after you have completed your treatment plan  MAKE SURE YOU  Understand these instructions. Will watch your condition. Will get help right away if you are not doing well or get worse.  Thank you for choosing an e-visit.  Your e-visit answers were reviewed by a board certified advanced clinical practitioner to complete your personal care plan. Depending upon the condition, your plan could have included both over the counter or prescription medications.  Please review your pharmacy choice. Make sure the pharmacy is open so you can pick up prescription now. If there is a problem, you may contact your provider through [employer] and have the prescription routed to another pharmacy.  Your safety is important to [place]. If you have drug allergies check your prescription carefully.   For the next 24 hours you can use MyChart to ask questions about today's visit, request a non-urgent call back, or ask for a work or school  excuse. You will get an email in the next two days asking about your experience. I hope that your e-visit has been valuable and will speed your recovery.  Meds ordered this encounter  Medications   fluticasone (FLONASE) 50 MCG/ACT nasal spray    Sig: Place 2 sprays into both nostrils daily.    Dispense:  16 g    Refill:  6   benzonatate (TESSALON) 100 MG capsule    Sig: Take 1 capsule (100 mg total) by mouth 3 (three) times daily as needed.    Dispense:  30 capsule    Refill:  0     I spent approximately 5 minutes reviewing the patient's history, current symptoms and coordinating their plan of care today.

## 2022-01-23 ENCOUNTER — Ambulatory Visit
Admission: EM | Admit: 2022-01-23 | Discharge: 2022-01-23 | Disposition: A | Discharge status: Home / Self Care | Payer: BC Managed Care – PPO | Attending: Emergency Medicine | Admitting: Emergency Medicine

## 2022-01-23 DIAGNOSIS — J029 Acute pharyngitis, unspecified: Secondary | ICD-10-CM | POA: Diagnosis not present

## 2022-01-23 LAB — POCT RAPID STREP A (OFFICE): Rapid Strep A Screen: NEGATIVE

## 2022-01-23 MED ORDER — LIDOCAINE VISCOUS HCL 2 % MT SOLN: 15.0000 mL | OROMUCOSAL | 0 refills | Status: DC | PRN

## 2022-01-23 NOTE — ED Triage Notes (Signed)
Patient to Urgent Care with complaints of sore throat and swollen tonsil x4 days. Right tonsil significantly more swollen with exudate. Hx of strep throat.  Denies any known fevers. Reports pain when swallowing.

## 2022-01-23 NOTE — Discharge Instructions (Addendum)
The strep test is negative.    Use the viscous lidocaine as directed.    Take Tylenol or ibuprofen as needed for fever or discomfort.      Follow-up with your primary care provider if your symptoms are not improving.     

## 2022-01-23 NOTE — ED Provider Notes (Signed)
[name]    CSN: [id number] Arrival date & time: 01/23/22  1722      History   Chief Complaint Chief Complaint  Patient presents with   Sore Throat    Sore throat and swollen tonsil - Entered by patient    HPI Yvonne Odom is a 26 y.o. female.  Patient presents with sore throat and swollen tonsils x4 days.  She has noted white spots on her right tonsil.  She denies fever, difficulty swallowing, cough, shortness of breath, rash, or other symptoms.  She reports history of strep throat.  Treatment at home with Tylenol.  Patient had an e-visit yesterday; diagnosed with viral URI; treated with Tessalon Perles and Flonase nasal spray.  Her medical history includes seasonal allergies, anxiety, depression, migraine headaches.  The history is provided by the patient and medical records.    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 No pertinent surgical history.  OB History     Gravida  0   Para  0   Term  0   Preterm  0   AB  0   Living  0      SAB  0   IAB  0   Ectopic  0   Multiple  0   Live Births  0            Home Medications    Prior to Admission medications   Medication Sig Start Date End Date Taking? Authorizing Provider  lidocaine (XYLOCAINE) 2 % solution Use as directed 15 mLs in the mouth or throat as needed for mouth pain. 01/23/22  Yes Mickie Bail, NP  acetaminophen (TYLENOL) 500 MG tablet Take 1,000 mg by mouth every 6 (six) hours as needed for headache.    [provider]  benzonatate (TESSALON) 100 MG capsule Take 1 capsule (100 mg total) by mouth 3 (three) times daily as needed. 01/22/22   Viviano Simas, FNP  Cholecalciferol (VITAMIN D) 125 MCG (5000 UT) CAPS Take  1 capsule by mouth daily in the afternoon.    [provider]  escitalopram (LEXAPRO) 10 MG tablet Take 1.5 tablets (15 mg total) by mouth daily. 01/04/22   Jarold Motto, PA  fluticasone (FLONASE) 50 MCG/ACT nasal spray Place 2 sprays into both nostrils daily. 01/22/22   Viviano Simas, FNP  ibuprofen (ADVIL) 200 MG tablet Take 600-800 mg by mouth every 6 (six) hours as needed for headache.    [provider]  Multiple Vitamins-Minerals (MULTI-VITAMIN GUMMIES) CHEW Chew 2 each by mouth daily in the afternoon.    [provider]  Norgestimate-Ethinyl Estradiol Triphasic (TRI-SPRINTEC) 0.18/0.215/0.25 MG-35 MCG tablet Take 1 tablet by mouth daily. 09/22/21   Jarold Motto, PA  nystatin ointment (MYCOSTATIN) Apply to affected area 1-2 times daily 01/04/22   Jarold Motto, PA  ondansetron (ZOFRAN) 4 MG tablet Take 1 tablet (4 mg total) by mouth every 8 (eight) hours as needed for nausea or vomiting. 01/04/22   Jarold Motto, PA  rizatriptan (MAXALT) 5 MG tablet Take 1 tablet (5 mg total) by mouth as needed for migraine. May repeat in 2 hours if needed 01/04/22   Jarold Motto, PA    Family  History Family History  Problem Relation Age of Onset   Alcohol abuse Father    Depression Sister    Depression Sister    Hyperlipidemia Maternal Grandmother    Diabetes Maternal Grandmother    Hypertension Maternal Grandfather    Hypertension Paternal Grandmother    Diabetes Paternal Grandmother    Kidney disease Paternal Grandmother    Breast cancer Neg Hx    Colon cancer Neg Hx     Social History Social History   Tobacco Use   Smoking status: Never    Passive exposure: Never   Smokeless tobacco: Never  Vaping Use   Vaping Use: Former   Quit date: 05/22/2020  Substance Use Topics   Alcohol use: Yes    Alcohol/week: 2.0 - 3.0 standard drinks of alcohol    Types: 2 - 3 Glasses of wine per week   Drug use: Never     Allergies   Patient has no known  allergies.   Review of Systems Review of Systems  Constitutional:  Negative for chills and fever.  HENT:  Positive for sore throat. Negative for ear pain, trouble swallowing and voice change.   Respiratory:  Negative for cough and shortness of breath.   Gastrointestinal:  Negative for diarrhea and vomiting.  Skin:  Negative for color change and rash.  All other systems reviewed and are negative.    Physical Exam Triage Vital Signs ED Triage Vitals  Enc Vitals Group     BP 01/23/22 1742 (!) 142/83     Pulse Rate 01/23/22 1742 75     Resp 01/23/22 1742 18     Temp 01/23/22 1742 99.1 F (37.3 C)     Temp src --      SpO2 01/23/22 1742 97 %     Weight 01/23/22 1749 250 lb (113.4 kg)     Height 01/23/22 1749 5\' 6"  (1.676 m)     Head Circumference --      Peak Flow --      Pain Score 01/23/22 1748 2     Pain Loc --      Pain Edu? --      Excl. in GC? --    No data found.  Updated Vital Signs BP (!) 142/83   Pulse 75   Temp 99.1 F (37.3 C)   Resp 18   Ht 5\' 6"  (1.676 m)   Wt 250 lb (113.4 kg)   LMP 01/09/2022 (Approximate)   SpO2 97%   BMI 40.35 kg/m   Visual Acuity Right Eye Distance:   Left Eye Distance:   Bilateral Distance:    Right Eye Near:   Left Eye Near:    Bilateral Near:     Physical Exam Vitals and nursing note reviewed.  Constitutional:      General: She is not in acute distress.    Appearance: Normal appearance. She is well-developed. She is not ill-appearing.  HENT:     Right Ear: Tympanic membrane normal.     Left Ear: Tympanic membrane normal.     Nose: Nose normal.     Mouth/Throat:     Mouth: Mucous membranes are moist.     Pharynx: Posterior oropharyngeal erythema present.     Comments: Right tonsil stones noted. Cardiovascular:     Rate and Rhythm: Normal rate and regular rhythm.     Heart sounds: Normal heart sounds.  Pulmonary:     Effort: Pulmonary effort is normal. No respiratory distress.  Breath sounds: Normal breath  sounds.  Musculoskeletal:     Cervical back: Neck supple.  Skin:    General: Skin is warm and dry.  Neurological:     Mental Status: She is alert.  Psychiatric:        Mood and Affect: Mood normal.        Behavior: Behavior normal.      UC Treatments / Results  Labs (all labs ordered are listed, but only abnormal results are displayed) Labs Reviewed  POCT RAPID STREP A (OFFICE)    EKG   Radiology No results found.  Procedures Procedures (including critical care time)  Medications Ordered in UC Medications - No data to display  Initial Impression / Assessment and Plan / UC Course  I have reviewed the triage vital signs and the nursing notes.  Pertinent labs & imaging results that were available during my care of the patient were reviewed by me and considered in my medical decision making (see chart for details).   Sore throat.  Rapid strep negative.  Treated sore throat with viscous lidocaine.  Discussed other symptomatic treatment including Tylenol or ibuprofen as needed.  Instructed patient to follow up with her PCP if her symptoms are not improving.  She agrees to plan of care.     Final Clinical Impressions(s) / UC Diagnoses   Final diagnoses:  Sore throat     Discharge Instructions      The strep test is negative.  Use the viscous lidocaine as directed.  Take Tylenol or ibuprofen as needed for fever or discomfort.  Follow up with your primary care provider if your symptoms are not improving.        ED Prescriptions     Medication Sig Dispense Auth. Provider   lidocaine (XYLOCAINE) 2 % solution Use as directed 15 mLs in the mouth or throat as needed for mouth pain. 100 mL Mickie Bail, NP      PDMP not reviewed this encounter.   Mickie Bail, NP 01/23/22 [facility]

## 2022-02-01 ENCOUNTER — Encounter: Payer: Self-pay | Admitting: Physician Assistant

## 2022-02-01 ENCOUNTER — Ambulatory Visit (INDEPENDENT_AMBULATORY_CARE_PROVIDER_SITE_OTHER): Payer: BC Managed Care – PPO | Admitting: Physician Assistant

## 2022-02-01 VITALS — BP 110/70 | HR 83 | Temp 98.2°F | Ht 66.0 in | Wt 263.2 lb

## 2022-02-01 DIAGNOSIS — J351 Hypertrophy of tonsils: Secondary | ICD-10-CM | POA: Diagnosis not present

## 2022-02-01 LAB — POCT RAPID STREP A (OFFICE): Rapid Strep A Screen: NEGATIVE

## 2022-02-01 LAB — POCT MONO (EPSTEIN BARR VIRUS): Mono, POC: NEGATIVE

## 2022-02-01 NOTE — Progress Notes (Signed)
Yvonne Odom is a 26 y.o. female here for a follow up of a pre-existing problem.  History of Present Illness:   Chief Complaint  Patient presents with   tonsils swollen    Pt c/o tonsils swollen x 10 days. Denies fever or chills.    HPI  Sore throat Did e-visit on 01/22/22 -- was told she had viral URI. She went to UC on 01/23/22 and had negative strep test. She has had ongoing tonsillar swelling since that time. Currently taking no medication. Last week was drinking tea. Did try some ibuprofen at the beginning.  At least 6 episodes of strep last year. Saw ENT and was told that she was not a candidate for surgery but the documentation states that she was. She would like to return for this.  Denies: current fever, chills, n/v/d, difficulty breathing   Past Medical History:  Diagnosis Date   Allergy    Anxiety    Depression      Social History   Tobacco Use   Smoking status: Never    Passive exposure: Never   Smokeless tobacco: Never  Vaping Use   Vaping Use: Former   Quit date: 05/22/2020  Substance Use Topics   Alcohol use: Yes    Alcohol/week: 2.0 - 3.0 standard drinks of alcohol    Types: 2 - 3 Glasses of wine per week   Drug use: Never    History reviewed. No pertinent surgical history.  Family History  Problem Relation Age of Onset   Alcohol abuse Father    Depression Sister    Depression Sister    Hyperlipidemia Maternal Grandmother    Diabetes Maternal Grandmother    Hypertension Maternal Grandfather    Hypertension Paternal Grandmother    Diabetes Paternal Grandmother    Kidney disease Paternal Grandmother    Breast cancer Neg Hx    Colon cancer Neg Hx     No Known Allergies  Current Medications:   Current Outpatient Medications:    acetaminophen (TYLENOL) 500 MG tablet, Take 1,000 mg by mouth every 6 (six) hours as needed for headache., Disp: , Rfl:    Cholecalciferol (VITAMIN D) 125 MCG (5000 UT) CAPS, Take 1 capsule by mouth daily in the  afternoon., Disp: , Rfl:    escitalopram (LEXAPRO) 10 MG tablet, Take 1.5 tablets (15 mg total) by mouth daily., Disp: 45 tablet, Rfl: 3   ibuprofen (ADVIL) 200 MG tablet, Take 600-800 mg by mouth every 6 (six) hours as needed for headache., Disp: , Rfl:    Multiple Vitamins-Minerals (MULTI-VITAMIN GUMMIES) CHEW, Chew 2 each by mouth daily in the afternoon., Disp: , Rfl:    nystatin ointment (MYCOSTATIN), Apply to affected area 1-2 times daily, Disp: 30 g, Rfl: 0   ondansetron (ZOFRAN) 4 MG tablet, Take 1 tablet (4 mg total) by mouth every 8 (eight) hours as needed for nausea or vomiting., Disp: 30 tablet, Rfl: 1   rizatriptan (MAXALT) 5 MG tablet, Take 1 tablet (5 mg total) by mouth as needed for migraine. May repeat in 2 hours if needed, Disp: 10 tablet, Rfl: 0   Review of Systems:   ROS Negative unless otherwise specified per HPI.  Vitals:   Vitals:   02/01/22 0848  BP: 110/70  Pulse: 83  Temp: 98.2 F (36.8 C)  TempSrc: Temporal  SpO2: 99%  Weight: 263 lb 4 oz (119.4 kg)  Height: 5\' 6"  (1.676 m)     Body mass index is 42.49 kg/m.  Physical  Exam:   Physical Exam Vitals and nursing note reviewed.  Constitutional:      General: She is not in acute distress.    Appearance: She is well-developed. She is not ill-appearing or toxic-appearing.  HENT:     Head: Normocephalic and atraumatic.     Right Ear: Tympanic membrane, ear canal and external ear normal. Tympanic membrane is not erythematous, retracted or bulging.     Left Ear: Tympanic membrane, ear canal and external ear normal. Tympanic membrane is not erythematous, retracted or bulging.     Nose: Nose normal.     Right Sinus: No maxillary sinus tenderness or frontal sinus tenderness.     Left Sinus: No maxillary sinus tenderness or frontal sinus tenderness.     Mouth/Throat:     Pharynx: Uvula midline. No posterior oropharyngeal erythema.     Tonsils: Tonsillar exudate present. 2+ on the right. 2+ on the left.  Eyes:      General: Lids are normal.     Conjunctiva/sclera: Conjunctivae normal.  Neck:     Trachea: Trachea normal.  Cardiovascular:     Rate and Rhythm: Normal rate and regular rhythm.     Pulses: Normal pulses.     Heart sounds: Normal heart sounds, S1 normal and S2 normal.  Pulmonary:     Effort: Pulmonary effort is normal.     Breath sounds: Normal breath sounds. No decreased breath sounds, wheezing, rhonchi or rales.  Lymphadenopathy:     Cervical: No cervical adenopathy.  Skin:    General: Skin is warm and dry.  Neurological:     Mental Status: She is alert.     GCS: GCS eye subscore is 4. GCS verbal subscore is 5. GCS motor subscore is 6.  Psychiatric:        Speech: Speech normal.        Behavior: Behavior normal. Behavior is cooperative.    Results for orders placed or performed in visit on 02/01/22  POCT rapid strep A  Result Value Ref Range   Rapid Strep A Screen Negative Negative  POCT Mono (Epstein Barr Virus)  Result Value Ref Range   Mono, POC Negative Negative     Assessment and Plan:   Tonsillar enlargement No red flags on exam Recommend that she start ibuprofen for her symptoms Mono test negative Recommend that she call [facility] to discuss her symptoms and discuss appt Follow-up as needed   Jarold Motto, PA-C

## 2022-02-01 NOTE — Patient Instructions (Addendum)
It was great to see you!  Go ahead and call [facility] -- [phone] Since you were seen last year, you may not need a new referral If you do end up needing a new referral, please let me know  We did a mono test today  We also did a throat culture for strep  Start ibuprofen for your throat swelling  Take care,  Jarold Motto PA-C

## 2022-02-02 LAB — CULTURE, GROUP A STREP

## 2022-02-09 ENCOUNTER — Encounter: Payer: Self-pay | Admitting: Physician Assistant

## 2022-02-10 ENCOUNTER — Ambulatory Visit: Payer: BC Managed Care – PPO | Admitting: Internal Medicine

## 2022-02-10 VITALS — BP 119/70 | HR 85 | Temp 98.7°F | Resp 14 | Ht 66.0 in | Wt 265.0 lb

## 2022-02-10 DIAGNOSIS — J0300 Acute streptococcal tonsillitis, unspecified: Secondary | ICD-10-CM

## 2022-02-10 MED ORDER — AMOXICILLIN-POT CLAVULANATE 875-125 MG PO TABS: 1.0000 | ORAL_TABLET | Freq: Two times a day (BID) | ORAL | 0 refills | Status: DC

## 2022-02-10 MED ORDER — PREDNISONE 20 MG PO TABS: ORAL_TABLET | ORAL | 0 refills | Status: DC

## 2022-02-10 NOTE — Patient Instructions (Addendum)
It was a pleasure seeing you today!  Today the plan is...  Treat speculatively your extremely swollen tonsils as  Strep tonsillitis -     Amoxicillin-Pot Clavulanate; Take 1 tablet by mouth 2 (two) times daily.  Dispense: 20 tablet; Refill: 0 -     predniSONE; Take 2 pills for 3 days, 1 pill for 4 days  Dispense: 10 tablet; Refill: 0    Loralee Pacas, MD

## 2022-02-10 NOTE — Progress Notes (Unsigned)
[place] at [facility]:  [phone]   Routine Medical Office Visit  Patient:  Yvonne Odom      Age: 26 y.o.       Sex:  female  Date:   02/10/2022  PCP:    Inda Coke, [place] Provider: Loralee Pacas, MD  Assessment/Plan:   Stephanny was seen today for swollen tonsils.  Strep tonsillitis  Will treat presumptively due to persistent severe symptoms bilateral tonsils affecting night time breathing. Did a strep and COVID swab but there was an accident and the strep and COVID swab were put in the wrong thing is so these orders work and then canceled and we decided to treat as strep presumptively as it was very uncomfortable for her to do the swab because she has a very poor Mallampati throat visualization score.  However when I looked at her throat when she was gagging there were very large exudative patches all over her extremely grade 4 hypertrophied tonsils.  She is miserable with persistent difficulty sleeping due to the tonsils creating pressure in the back of her throat.  For this reason not only will I be giving antibiotic I will give prednisone steroid to try to get the swelling down and give her some comfort for sleeping  Because she reports that this is happened repeatedly with great difficulty getting positive strep explanation for the problem she would like to have her tonsils removed and I support that for the purposes of the ENT referral I recommend she complete that and have them reference this note in support of her.     Subjective:   Yvonne Odom is a 26 y.o. female with PMH significant for: Past Medical History:  Diagnosis Date   Allergy    Anxiety    Depression      She main concern for today's visit is: Chief Complaint  Patient presents with   Swollen tonsils    Intermittent for 3 weeks.    26 year old with persistently swollen tonsils which were just evaluated last week and negative on mono and strep swab at that  time but her tonsils have continued to remain very swollen and bothersome to her.  She is a ENT referral pending but was hoping to get some additional testing          Objective:  Physical Exam: BP 119/70 (BP Location: Right Arm, Patient Position: Sitting)   Pulse 85   Temp 98.7 F (37.1 C) (Temporal)   Resp 14   Ht 5\' 6"  (1.676 m)   Wt 265 lb (120.2 kg)   LMP 01/09/2022 (Approximate)   SpO2 97%   BMI 42.77 kg/m   She  is a polite, friendly, and genuine person Constitutional: NAD, AAO, not ill-appearing  Neuro: alert, no focal deficit obvious, articulate speech Psych: normal mood, behavior, thought content   Problem specific physical exam findings:  Very grade 4 swollen exudative ulcerative bilateral tonsillar hypertrophy uncomfortable appearing But she has no difficulty breathing at rest and she is controlling her salivary  Results:  No results found for any visits on 02/10/22.   Recent Results (from the past 2160 hour(s))  Cytology - PAP( [facility])     Status: Abnormal   Collection Time: 12/06/21  3:09 PM  Result Value Ref Range   Adequacy      Satisfactory for evaluation; transformation zone component PRESENT.   Diagnosis - Low grade squamous intraepithelial lesion (LSIL) (A)   POCT  rapid strep A     Status: None   Collection Time: 01/23/22  6:02 PM  Result Value Ref Range   Rapid Strep A Screen Negative Negative  POCT rapid strep A     Status: None   Collection Time: 02/01/22  8:57 AM  Result Value Ref Range   Rapid Strep A Screen Negative Negative  POCT Mono (Epstein Barr Virus)     Status: None   Collection Time: 02/01/22  9:27 AM  Result Value Ref Range   Mono, POC Negative Negative  Culture, Group A Strep     Status: None   Collection Time: 02/01/22  9:28 AM   Specimen: Throat  Result Value Ref Range   MICRO NUMBER: CANCELED     Comment: Result canceled by the ancillary.   SPECIMEN QUALITY: CANCELED     Comment: Result canceled by the ancillary.    SOURCE: CANCELED     Comment: Result canceled by the ancillary.   STATUS: CANCELED     Comment: Result canceled by the ancillary.   RESULT: CANCELED     Comment: Result canceled by the ancillary.

## 2022-02-23 ENCOUNTER — Telehealth: Payer: BC Managed Care – PPO | Admitting: Physician Assistant

## 2022-02-23 DIAGNOSIS — T3695XA Adverse effect of unspecified systemic antibiotic, initial encounter: Secondary | ICD-10-CM | POA: Diagnosis not present

## 2022-02-23 DIAGNOSIS — B379 Candidiasis, unspecified: Secondary | ICD-10-CM | POA: Diagnosis not present

## 2022-02-23 MED ORDER — FLUCONAZOLE 150 MG PO TABS: 150.0000 mg | ORAL_TABLET | ORAL | 0 refills | Status: DC | PRN

## 2022-02-23 NOTE — Progress Notes (Signed)

## 2022-03-12 ENCOUNTER — Other Ambulatory Visit: Payer: Self-pay | Admitting: Physician Assistant

## 2022-04-04 ENCOUNTER — Encounter: Payer: BC Managed Care – PPO | Admitting: Physician Assistant

## 2022-04-06 NOTE — Progress Notes (Signed)
Subjective:    Yvonne Odom is a 26 y.o. female and is here for a comprehensive physical exam.  HPI  Health Maintenance Due  Topic Date Due   HIV Screening  Never done   Hepatitis C Screening  Never done    Acute Concerns: None  Chronic Issues: Anxiety/Depression  Patient is currently taking Lexapro 15 mg daily with no complications. She is compliant with her medication.  Tolerating her medication but she has been feeling a little more anxious lately. No SI/HI.   Migraines Overall the same. Has migraines 1-4 times per month.  Didn't like the way maxalt made her feel -- "out of it." Would like to trial possible new medication.  Nausea  Overall the same Onsets x2-4 a week  She has taken Zofran, but found no relief Denies any heartburn.  Health Maintenance: Immunizations -- UTD, flu shot given 04/10/2022 Colonoscopy -- N/A Mammogram -- N/A PAP -- 12/06/2021 Bone Density -- N/A Diet -- Diet is okay  Exercise -- Not currently exercising  Sleep habits -- Good Mood -- Good  UTD with dentist? - Yes UTD with eye doctor? - Yes  Weight history: Wt Readings from Last 10 Encounters:  02/10/22 265 lb (120.2 kg)  02/01/22 263 lb 4 oz (119.4 kg)  01/23/22 250 lb (113.4 kg)  01/04/22 255 lb (115.7 kg)  12/06/21 255 lb (115.7 kg)  11/25/21 253 lb (114.8 kg)  09/22/21 249 lb 6.1 oz (113.1 kg)   There is no height or weight on file to calculate BMI. No LMP recorded.  Alcohol use:  reports current alcohol use of about 2.0 - 3.0 standard drinks of alcohol per week.  Tobacco use:  Tobacco Use: Low Risk  (02/01/2022)   Patient History    Smoking Tobacco Use: Never    Smokeless Tobacco Use: Never    Passive Exposure: Never   Eligible for lung cancer screening? no     01/04/2022    1:42 PM  Depression screen PHQ 2/9  Decreased Interest 1  Down, Depressed, Hopeless 0  PHQ - 2 Score 1  Altered sleeping 3  Tired, decreased energy 3  Change in appetite 1   Feeling bad or failure about yourself  1  Trouble concentrating 2  Moving slowly or fidgety/restless 2  Suicidal thoughts 0  PHQ-9 Score 13  Difficult doing work/chores Somewhat difficult     Other providers/specialists: Patient Care Team: Jarold Motto, [place] as PCP - General (Physician Assistant)    PMHx, SurgHx, SocialHx, Medications, and Allergies were reviewed in the Visit Navigator and updated as appropriate.   Past Medical History:  Diagnosis Date   Allergy    Anxiety    Depression     History reviewed. No pertinent surgical history.   Family History  Problem Relation Age of Onset   Alcohol abuse Father    Depression Sister    Depression Sister    Hyperlipidemia Maternal Grandmother    Diabetes Maternal Grandmother    Hypertension Maternal Grandfather    Hypertension Paternal Grandmother    Diabetes Paternal Grandmother    Kidney disease Paternal Grandmother    Breast cancer Neg Hx    Colon cancer Neg Hx     Social History   Tobacco Use   Smoking status: Never    Passive exposure: Never   Smokeless tobacco: Never  Vaping Use   Vaping Use: Former   Quit date: 05/22/2020  Substance Use Topics   Alcohol use: Yes  Alcohol/week: 2.0 - 3.0 standard drinks of alcohol    Types: 2 - 3 Glasses of wine per week   Drug use: Never    Review of Systems:   Review of Systems  Constitutional:  Negative for chills, fever, malaise/fatigue and weight loss.  HENT:  Negative for hearing loss, sinus pain and sore throat.   Respiratory:  Negative for cough and hemoptysis.   Cardiovascular:  Negative for chest pain, palpitations, leg swelling and PND.  Gastrointestinal:  Positive for nausea. Negative for abdominal pain, constipation, diarrhea, heartburn and vomiting.  Genitourinary:  Negative for dysuria, frequency and urgency.  Musculoskeletal:  Negative for back pain, myalgias and neck pain.  Skin:  Negative for itching and rash.  Neurological:  Negative for  dizziness, tingling, seizures and headaches.  Endo/Heme/Allergies:  Negative for polydipsia.  Psychiatric/Behavioral:  Negative for depression. The patient is not nervous/anxious.     Objective:   There were no vitals taken for this visit. There is no height or weight on file to calculate BMI.   General Appearance:    Alert, cooperative, no distress, appears stated age  Head:    Normocephalic, without obvious abnormality, atraumatic  Eyes:    PERRL, conjunctiva/corneas clear, EOM's intact, fundi    benign, both eyes  Ears:    Normal TM's and external ear canals, both ears  Nose:   Nares normal, septum midline, mucosa normal, no drainage    or sinus tenderness  Throat:   Lips, mucosa, and tongue normal; teeth and gums normal  Neck:   Supple, symmetrical, trachea midline, no adenopathy;    thyroid:  no enlargement/tenderness/nodules; no carotid   bruit or JVD  Back:     Symmetric, no curvature, ROM normal, no CVA tenderness  Lungs:     Clear to auscultation bilaterally, respirations unlabored  Chest Wall:    No tenderness or deformity   Heart:    Regular rate and rhythm, S1 and S2 normal, no murmur, rub or gallop  Breast Exam:    Deferred  Abdomen:     Soft, non-tender, bowel sounds active all four quadrants,    no masses, no organomegaly  Genitalia:    Deferred  Extremities:   Extremities normal, atraumatic, no cyanosis or edema  Pulses:   2+ and symmetric all extremities  Skin:   Skin color, texture, turgor normal, no rashes or lesions  Lymph nodes:   Cervical, supraclavicular, and axillary nodes normal  Neurologic:   CNII-XII intact, normal strength, sensation and reflexes    throughout    Assessment/Plan:   Routine physical examination Today patient counseled on age appropriate routine health concerns for screening and prevention, each reviewed and up to date or declined. Immunizations reviewed and up to date or declined. Labs ordered and reviewed. Risk factors for  depression reviewed and negative. Hearing function and visual acuity are intact. ADLs screened and addressed as needed. Functional ability and level of safety reviewed and appropriate. Education, counseling and referrals performed based on assessed risks today. Patient provided with a copy of personalized plan for preventive services.  Migraine without aura and without status migrainosus, not intractable Ongoing Will trial switching Lexapro to Elavil for help with migraine control We will also change abortive rx to Nurtec -- I have sent this in for her Follow-up in 3 months, sooner if concerns  Nausea She is hoping that her nausea may improve after tonsillectomy Will ask her to trial pepcid or nexium OTC if nausea persists after tonsillectomy If  persists, will refer to GI  Encounter for screening for other viral diseases Update Hep C  Screening for HIV (human immunodeficiency virus) Update HIV  Obesity, unspecified classification, unspecified obesity type, unspecified whether serious comorbidity present Update blood work including TSH and HgbA1c Follow-up to closely monitor as patient has concerns  I,Moesha Myer,acting as a Neurosurgeon for [employer], PA.,have documented all relevant documentation on the behalf of Jarold Motto, PA,as directed by  Jarold Motto, PA while in the presence of Jarold Motto, [place].  I, Jarold Motto, [place], have reviewed all documentation for this visit. The documentation on 04/10/22 for the exam, diagnosis, procedures, and orders are all accurate and complete.  Jarold Motto PA-C

## 2022-04-10 ENCOUNTER — Encounter: Payer: Self-pay | Admitting: Physician Assistant

## 2022-04-10 ENCOUNTER — Ambulatory Visit (INDEPENDENT_AMBULATORY_CARE_PROVIDER_SITE_OTHER): Payer: BC Managed Care – PPO | Admitting: Physician Assistant

## 2022-04-10 VITALS — BP 114/80 | HR 80 | Temp 98.0°F | Ht 66.0 in | Wt 273.5 lb

## 2022-04-10 DIAGNOSIS — Z0000 Encounter for general adult medical examination without abnormal findings: Secondary | ICD-10-CM | POA: Diagnosis not present

## 2022-04-10 DIAGNOSIS — E669 Obesity, unspecified: Secondary | ICD-10-CM

## 2022-04-10 DIAGNOSIS — Z1159 Encounter for screening for other viral diseases: Secondary | ICD-10-CM

## 2022-04-10 DIAGNOSIS — G43009 Migraine without aura, not intractable, without status migrainosus: Secondary | ICD-10-CM

## 2022-04-10 DIAGNOSIS — Z23 Encounter for immunization: Secondary | ICD-10-CM

## 2022-04-10 DIAGNOSIS — R110 Nausea: Secondary | ICD-10-CM | POA: Diagnosis not present

## 2022-04-10 DIAGNOSIS — Z114 Encounter for screening for human immunodeficiency virus [HIV]: Secondary | ICD-10-CM

## 2022-04-10 MED ORDER — NURTEC 75 MG PO TBDP: 75.0000 mg | ORAL_TABLET | ORAL | 1 refills | Status: DC | PRN

## 2022-04-10 MED ORDER — AMITRIPTYLINE HCL 25 MG PO TABS
25.0000 mg | ORAL_TABLET | Freq: Every day | ORAL | 1 refills | Status: DC
Start: 2022-04-10 — End: 2022-05-10

## 2022-04-10 NOTE — Patient Instructions (Addendum)
It was great to see you!  Take 10 mg lexapro daily x 1 week Stop lexapro then, and start 25 mg elavil  Message me in 1-3 months with how your mood is doing  After your surgery, if your nausea persists, trial an over the counter acid (nexium) x 2 weeks, and if still not helping with nausea, please call me and we will send to GI  Please go to the lab for blood work.   Our office will call you with your results unless you have chosen to receive results via MyChart.  If your blood work is normal we will follow-up each year for physicals and as scheduled for chronic medical problems.  If anything is abnormal we will treat accordingly and get you in for a follow-up.  Take care,  Lelon Mast

## 2022-04-11 LAB — LIPID PANEL
Cholesterol: 144 mg/dL (ref 0–200)
HDL: 55.9 mg/dL (ref >39.00)
LDL Cholesterol: 71 mg/dL (ref 0–99)
NonHDL: 87.76
Total CHOL/HDL Ratio: 3
Triglycerides: 82 mg/dL (ref 0.0–149.0)
VLDL: 16.4 mg/dL (ref 0.0–40.0)

## 2022-04-11 LAB — COMPREHENSIVE METABOLIC PANEL
ALT: 24 U/L (ref 0–35)
AST: 19 U/L (ref 0–37)
Albumin: 4.4 g/dL (ref 3.5–5.2)
Alkaline Phosphatase: 55 U/L (ref 39–117)
BUN: 12 mg/dL (ref 6–23)
CO2: 27 mEq/L (ref 19–32)
Calcium: 9.5 mg/dL (ref 8.4–10.5)
Chloride: 104 mEq/L (ref 96–112)
Creatinine, Ser: 0.81 mg/dL (ref 0.40–1.20)
GFR: 101.93 mL/min (ref >60.00)
Glucose, Bld: 89 mg/dL (ref 70–99)
Potassium: 4 mEq/L (ref 3.5–5.1)
Sodium: 139 mEq/L (ref 135–145)
Total Bilirubin: 0.2 mg/dL (ref 0.2–1.2)
Total Protein: 7.4 g/dL (ref 6.0–8.3)

## 2022-04-11 LAB — HEMOGLOBIN A1C: Hgb A1c MFr Bld: 5.6 % (ref 4.6–6.5)

## 2022-04-11 LAB — CBC WITH DIFFERENTIAL/PLATELET
Basophils Absolute: 0.1 10*3/uL (ref 0.0–0.1)
Basophils Relative: 1.4 % (ref 0.0–3.0)
Eosinophils Absolute: 0.1 10*3/uL (ref 0.0–0.7)
Eosinophils Relative: 1.5 % (ref 0.0–5.0)
HCT: 39.1 % (ref 36.0–46.0)
Hemoglobin: 12.5 g/dL (ref 12.0–15.0)
Lymphocytes Relative: 39.1 % (ref 12.0–46.0)
Lymphs Abs: 3.2 10*3/uL (ref 0.7–4.0)
MCHC: 31.9 g/dL (ref 30.0–36.0)
MCV: 73.8 fl — ABNORMAL LOW (ref 78.0–100.0)
Monocytes Absolute: 0.3 10*3/uL (ref 0.1–1.0)
Monocytes Relative: 4.2 % (ref 3.0–12.0)
Neutro Abs: 4.3 10*3/uL (ref 1.4–7.7)
Neutrophils Relative %: 53.8 % (ref 43.0–77.0)
Platelets: 247 10*3/uL (ref 150.0–400.0)
RBC: 5.3 Mil/uL — ABNORMAL HIGH (ref 3.87–5.11)
RDW: 15.4 % (ref 11.5–15.5)
WBC: 8.1 10*3/uL (ref 4.0–10.5)

## 2022-04-11 LAB — HEPATITIS C ANTIBODY: Hepatitis C Ab: NONREACTIVE

## 2022-04-11 LAB — TSH: TSH: 2.16 u[IU]/mL (ref 0.35–5.50)

## 2022-04-11 LAB — HIV ANTIBODY (ROUTINE TESTING W REFLEX): HIV 1&2 Ab, 4th Generation: NONREACTIVE

## 2022-04-16 ENCOUNTER — Encounter: Payer: Self-pay | Admitting: Physician Assistant

## 2022-04-16 DIAGNOSIS — E669 Obesity, unspecified: Secondary | ICD-10-CM

## 2022-04-26 ENCOUNTER — Ambulatory Visit: Payer: BC Managed Care – PPO | Admitting: Nurse Practitioner

## 2022-05-04 ENCOUNTER — Ambulatory Visit: Payer: BC Managed Care – PPO | Admitting: Nurse Practitioner

## 2022-05-04 ENCOUNTER — Encounter: Payer: Self-pay | Admitting: Nurse Practitioner

## 2022-05-04 VITALS — BP 108/78 | Temp 98.3°F

## 2022-05-04 DIAGNOSIS — R1084 Generalized abdominal pain: Secondary | ICD-10-CM | POA: Diagnosis not present

## 2022-05-04 DIAGNOSIS — N898 Other specified noninflammatory disorders of vagina: Secondary | ICD-10-CM

## 2022-05-04 LAB — URINALYSIS, COMPLETE W/RFL CULTURE
Bacteria, UA: NONE SEEN /HPF
Bilirubin Urine: NEGATIVE
Glucose, UA: NEGATIVE
Hgb urine dipstick: NEGATIVE
Hyaline Cast: NONE SEEN /LPF
Ketones, ur: NEGATIVE
Leukocyte Esterase: NEGATIVE
Nitrites, Initial: NEGATIVE
Protein, ur: NEGATIVE
RBC / HPF: NONE SEEN /HPF (ref 0–2)
Specific Gravity, Urine: 1.02 (ref 1.001–1.035)
WBC, UA: NONE SEEN /HPF (ref 0–5)
pH: 6 (ref 5.0–8.0)

## 2022-05-04 LAB — WET PREP FOR TRICH, YEAST, CLUE

## 2022-05-04 LAB — PREGNANCY, URINE: Preg Test, Ur: NEGATIVE

## 2022-05-04 LAB — NO CULTURE INDICATED

## 2022-05-04 NOTE — Progress Notes (Signed)
   Acute Office Visit  Subjective:    Patient ID: Yvonne Odom, female    DOB: May 20, 1996, Yvonne y.o.   MRN: [id number]   HPI Yvonne y.o. presents today for lower abdominal pain x 1.5 months. Pain is intermittent, random, mid to upper abdomen on both sides, achy/crampy/dull, nothing makes it worse or better. Denies changes in bowels but does have mild nausea. Stopped COCs 2- 3 months ago. Does not feel it is related to menses. Feels abdominal muscles cramp up sometimes. Denies urinary or vaginal symptoms. Sexually active. Does have increased stress.    Review of Systems  Constitutional: Negative.   Gastrointestinal:  Positive for abdominal pain and nausea. Negative for constipation, diarrhea and vomiting.  Genitourinary:  Negative for dysuria, flank pain, frequency, hematuria, urgency, vaginal discharge and vaginal pain.       Objective:    Physical Exam Constitutional:      Appearance: Normal appearance.  Abdominal:     Tenderness: There is no abdominal tenderness. There is no guarding or rebound.  Genitourinary:    General: Normal vulva.     Vagina: Normal.     Cervix: Normal.     Uterus: Normal.      Adnexa: Right adnexa normal and left adnexa normal.     BP 108/78 (BP Location: Left Arm, Patient Position: Sitting, Cuff Size: Large)   Temp 98.3 F (36.8 C) (Oral)   LMP 04/05/2022 (Exact Date)  Wt Readings from Last 3 Encounters:  04/10/22 273 lb 8 oz (124.1 kg)  09/Yvonne/23 265 lb (120.2 kg)  02/01/22 263 lb 4 oz (119.4 kg)        Patient informed chaperone available to be present for breast and/or pelvic exam. Patient has requested no chaperone to be present. Patient has been advised what will be completed during breast and pelvic exam.   UA negative UPT negative Wet prep negative  Assessment & Plan:   Problem List Items Addressed This Visit   None Visit Diagnoses     Generalized abdominal pain    -  Primary   Relevant Orders   Pregnancy, urine (Completed)    Urinalysis,Complete w/RFL Culture (Completed)   REFLEXIVE URINE CULTURE (Completed)   Vaginal discharge       Relevant Orders   WET PREP FOR TRICH, YEAST, CLUE      Plan: Negative wet prep, UA, UPT and exam today. Likely GI related. Recommend following up with PCP.      Yvonne Mackie DNP, 4:28 PM 05/04/2022

## 2022-05-10 ENCOUNTER — Ambulatory Visit (INDEPENDENT_AMBULATORY_CARE_PROVIDER_SITE_OTHER): Payer: BC Managed Care – PPO | Admitting: Physician Assistant

## 2022-05-10 ENCOUNTER — Ambulatory Visit: Admit: 2022-05-10 | Payer: BC Managed Care – PPO | Admitting: Otolaryngology

## 2022-05-10 ENCOUNTER — Ambulatory Visit (INDEPENDENT_AMBULATORY_CARE_PROVIDER_SITE_OTHER)
Admission: RE | Admit: 2022-05-10 | Discharge: 2022-05-10 | Disposition: A | Discharge status: Home / Self Care | Payer: BC Managed Care – PPO | Source: Ambulatory Visit | Attending: Physician Assistant | Admitting: Physician Assistant

## 2022-05-10 ENCOUNTER — Encounter: Payer: Self-pay | Admitting: Physician Assistant

## 2022-05-10 VITALS — BP 110/74 | HR 82 | Temp 98.0°F | Ht 66.0 in | Wt 273.5 lb

## 2022-05-10 DIAGNOSIS — R1084 Generalized abdominal pain: Secondary | ICD-10-CM

## 2022-05-10 DIAGNOSIS — G43009 Migraine without aura, not intractable, without status migrainosus: Secondary | ICD-10-CM

## 2022-05-10 LAB — CBC WITH DIFFERENTIAL/PLATELET
Basophils Absolute: 0 10*3/uL (ref 0.0–0.1)
Basophils Relative: 0.8 % (ref 0.0–3.0)
Eosinophils Absolute: 0.1 10*3/uL (ref 0.0–0.7)
Eosinophils Relative: 2.4 % (ref 0.0–5.0)
HCT: 37 % (ref 36.0–46.0)
Hemoglobin: 12 g/dL (ref 12.0–15.0)
Lymphocytes Relative: 44.5 % (ref 12.0–46.0)
Lymphs Abs: 2.4 10*3/uL (ref 0.7–4.0)
MCHC: 32.4 g/dL (ref 30.0–36.0)
MCV: 73.2 fl — ABNORMAL LOW (ref 78.0–100.0)
Monocytes Absolute: 0.3 10*3/uL (ref 0.1–1.0)
Monocytes Relative: 6.3 % (ref 3.0–12.0)
Neutro Abs: 2.5 10*3/uL (ref 1.4–7.7)
Neutrophils Relative %: 46 % (ref 43.0–77.0)
Platelets: 222 10*3/uL (ref 150.0–400.0)
RBC: 5.05 Mil/uL (ref 3.87–5.11)
RDW: 14.9 % (ref 11.5–15.5)
WBC: 5.3 10*3/uL (ref 4.0–10.5)

## 2022-05-10 LAB — COMPREHENSIVE METABOLIC PANEL
ALT: 48 U/L — ABNORMAL HIGH (ref 0–35)
AST: 22 U/L (ref 0–37)
Albumin: 4.2 g/dL (ref 3.5–5.2)
Alkaline Phosphatase: 57 U/L (ref 39–117)
BUN: 9 mg/dL (ref 6–23)
CO2: 27 mEq/L (ref 19–32)
Calcium: 9.4 mg/dL (ref 8.4–10.5)
Chloride: 107 mEq/L (ref 96–112)
Creatinine, Ser: 0.76 mg/dL (ref 0.40–1.20)
GFR: 109.97 mL/min (ref >60.00)
Glucose, Bld: 89 mg/dL (ref 70–99)
Potassium: 4.7 mEq/L (ref 3.5–5.1)
Sodium: 142 mEq/L (ref 135–145)
Total Bilirubin: 0.5 mg/dL (ref 0.2–1.2)
Total Protein: 6.8 g/dL (ref 6.0–8.3)

## 2022-05-10 LAB — H. PYLORI ANTIBODY, IGG: H Pylori IgG: NEGATIVE

## 2022-05-10 LAB — LIPASE: Lipase: 17 U/L (ref 11.0–59.0)

## 2022-05-10 SURGERY — TONSILLECTOMY
Anesthesia: General | Laterality: Bilateral

## 2022-05-10 MED ORDER — AMITRIPTYLINE HCL 50 MG PO TABS: 50.0000 mg | ORAL_TABLET | Freq: Every day | ORAL | 1 refills | Status: DC

## 2022-05-10 NOTE — Patient Instructions (Signed)
It was great to see you!  We are going to get blood work today  An order for an xray has been put in for you. To have this done, you can walk in at the [facility] location without a scheduled appointment.  The address is [address] It is across the street from [facility]. Lab and x-xray are located in the basement.   Hours of operation are M-F 8:30am to 5:00pm.  Please note that they are closed for lunch between 12:30 and 1:00pm.  Start over the counter prilosec/omeprazole OR pepcid (generic is fine) for 2 weeks to see if this helps with nausea.  Increase elavil to 50 mg daily  Let's follow-up in 2-4 weeks if no improvement, sooner if you have concerns.  Take care,  Jarold Motto PA-C

## 2022-05-10 NOTE — Progress Notes (Signed)
Yvonne Odom is a 26 y.o. female here for a new problem.  History of Present Illness:   Chief Complaint  Patient presents with   Abdominal Pain    Pt c/o mid abdominal pain x 1.5 months, increase in pain past week. Also c/o nausea, no vomiting. Had diarrhea x 2 last week.    HPI  Abdominal pain Onset x1.5 months, worsened in both frequency and severity in last week. Pain is locating along mid to upper bilateral abdomen. Pain is intermittent, achy, cramping, and dull. The pain waxes and wanes and can be sharp at times. She actively has the abdominal pain currently at the time of exam. Her pain is sometimes triggered by eating. However, she has not eaten yet this morning and still has the pain. She reports severe nausea but no vomiting last week. She notes accompanying lower back pain.  Denies any constipation or diarrhea. Denies any urinary symptoms.  Stopped birth control a few months ago but does not feel that her symptoms are related. Per review of NP Yvonne Odom's noted: She had a negative pregnancy test with her OBGYN on 05/04/22. UA, wet prep, and exam negative at that time.  Migraines Still has migraines x1-4 times per month. Using Elavil 25mg  at bedtime with no relief.   Past Medical History:  Diagnosis Date   Allergy    Anxiety    Depression      Social History   Tobacco Use   Smoking status: Never    Passive exposure: Never   Smokeless tobacco: Never  Vaping Use   Vaping Use: Former   Quit date: 05/22/2020  Substance Use Topics   Alcohol use: Yes    Alcohol/week: 2.0 - 3.0 standard drinks of alcohol    Types: 2 - 3 Glasses of wine per week   Drug use: Never    History reviewed. No pertinent surgical history.  Family History  Problem Relation Age of Onset   Alcohol abuse Father    Depression Sister    Depression Sister    Hyperlipidemia Maternal Grandmother    Diabetes Maternal Grandmother    Hypertension Maternal Grandfather     Hypertension Paternal Grandmother    Diabetes Paternal Grandmother    Kidney disease Paternal Grandmother    Breast cancer Neg Hx    Colon cancer Neg Hx     No Known Allergies  Current Medications:   Current Outpatient Medications:    acetaminophen (TYLENOL) 500 MG tablet, Take 1,000 mg by mouth every 6 (six) hours as needed for headache., Disp: , Rfl:    amitriptyline (ELAVIL) 25 MG tablet, Take 1 tablet (25 mg total) by mouth at bedtime., Disp: 30 tablet, Rfl: 1   Cholecalciferol (VITAMIN D) 125 MCG (5000 UT) CAPS, Take 1 capsule by mouth daily in the afternoon., Disp: , Rfl:    ibuprofen (ADVIL) 200 MG tablet, Take 600-800 mg by mouth every 6 (six) hours as needed for headache., Disp: , Rfl:    Multiple Vitamins-Minerals (MULTI-VITAMIN GUMMIES) CHEW, Chew 2 each by mouth daily in the afternoon., Disp: , Rfl:    ondansetron (ZOFRAN) 4 MG tablet, Take 1 tablet (4 mg total) by mouth every 8 (eight) hours as needed for nausea or vomiting., Disp: 30 tablet, Rfl: 1   Review of Systems:   Review of Systems  Constitutional:  Negative for chills and fever.  Gastrointestinal:  Positive for abdominal pain, nausea and vomiting. Negative for blood in stool, constipation, diarrhea and melena.  Genitourinary:  Negative for dysuria, frequency, hematuria and urgency.  Musculoskeletal:  Positive for back pain.  Neurological:  Positive for headaches.    Vitals:   Vitals:   05/10/22 0812  BP: 110/74  Pulse: 82  Temp: 98 F (36.7 C)  TempSrc: Temporal  SpO2: 99%  Weight: 273 lb 8 oz (124.1 kg)  Height: 5\' 6"  (1.676 m)     Body mass index is 44.14 kg/m.  Physical Exam:   Physical Exam Vitals and nursing note reviewed.  Constitutional:      General: She is not in acute distress.    Appearance: She is well-developed. She is not ill-appearing or toxic-appearing.  Cardiovascular:     Rate and Rhythm: Normal rate and regular rhythm.     Pulses: Normal pulses.     Heart sounds: Normal  heart sounds, S1 normal and S2 normal.  Pulmonary:     Effort: Pulmonary effort is normal.     Breath sounds: Normal breath sounds.  Abdominal:     General: Abdomen is flat. Bowel sounds are normal.     Palpations: Abdomen is soft.     Tenderness: There is no abdominal tenderness. There is no right CVA tenderness or left CVA tenderness.  Skin:    General: Skin is warm and dry.  Neurological:     Mental Status: She is alert.     GCS: GCS eye subscore is 4. GCS verbal subscore is 5. GCS motor subscore is 6.  Psychiatric:        Speech: Speech normal.        Behavior: Behavior normal. Behavior is cooperative.     Assessment and Plan:   Generalized abdominal pain No red flags on exam -- no evidence of acute abdomen warranting ER evaluation/stat imaging Ddx includes but is not limited to - gastritis, constipation, PUD, H Pylori, IBS, among others Gynecology note reviewed Will order blood work and abdominal xray Follow-up in 2-4 weeks, sooner if concerns  Migraine without aura and without status migrainosus, not intractable Uncontrolled No red flags Increase elavil to 50 mg Follow-up in 2-4 weeks, sooner if concerns  I,Alexis Herring,acting as a scribe for , PA.,have documented all relevant documentation on the behalf of [employer], PA,as directed by  Jarold Motto, PA while in the presence of Jarold Motto, Jarold Motto.  I, [place], Jarold Motto, have reviewed all documentation for this visit. The documentation on 05/10/22 for the exam, diagnosis, procedures, and orders are all accurate and complete.  05/12/22, PA-C

## 2022-05-29 ENCOUNTER — Encounter: Payer: BC Managed Care – PPO | Admitting: Physician Assistant

## 2022-06-13 ENCOUNTER — Encounter: Payer: Self-pay | Admitting: Family Medicine

## 2022-06-13 ENCOUNTER — Ambulatory Visit: Payer: BC Managed Care – PPO | Admitting: Family Medicine

## 2022-06-13 VITALS — BP 124/78 | HR 108 | Temp 97.7°F | Wt 274.6 lb

## 2022-06-13 DIAGNOSIS — J020 Streptococcal pharyngitis: Secondary | ICD-10-CM | POA: Diagnosis not present

## 2022-06-13 LAB — POCT RAPID STREP A (OFFICE): Rapid Strep A Screen: POSITIVE — AB

## 2022-06-13 MED ORDER — AMOXICILLIN-POT CLAVULANATE 875-125 MG PO TABS: 1.0000 | ORAL_TABLET | Freq: Two times a day (BID) | ORAL | 0 refills | Status: DC

## 2022-06-13 NOTE — Progress Notes (Signed)
Assessment/Plan:   Problem List Items Addressed This Visit   None Visit Diagnoses     Strep throat    -  Primary   Relevant Medications   amoxicillin-clavulanate (AUGMENTIN) 875-125 MG tablet   Other Relevant Orders   POCT rapid strep A (Completed)      The clinical plan includes prescribing an antibiotic such as Amoxicillin to address the bacterial infection, which the patient has used previously with no reported allergies. The recommendation for a tonsillectomy remains in place, and the patient plans to schedule this procedure. Education on symptom management at home with saltwater gargles, continued use of Ibuprofen for pain and inflammation, as well as avoiding dehydration is provided. Additionally, it is discussed that "magic mouthwash" may be prescribed if the throat pain is severe, but the patient declined this option at this time.  There are no discontinued medications.    Subjective:  HPI: Encounter date: 06/13/2022  Yvonne Odom is a 27 y.o. female who has GAD (generalized anxiety disorder); Depression, major, single episode, mild ([place]); and Vitamin D deficiency on their problem list..   She  has a past medical history of Allergy, Anxiety, and Depression..   CHIEF COMPLAINT: Patient presents with a sore throat suspected to be streptococcal pharyngitis based on symptoms and previous history.  HISTORY OF PRESENT ILLNESS:  Problem 1: The patient reports onset of sore throat symptoms approximately one to one and a half days prior to this encounter. She has a personal history of recurrent streptococcal pharyngitis, with the last episode occurring somewhere between September and November of the previous year. 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 The patient states she is now looking to reschedule the procedure for March. Currently, the patient is managing symptoms with over-the-counter medications  and home remedies.  Patient has been using ibuprofen and Tylenol with some relief.  REVIEW OF SYSTEMS: The patient denies fever, chills, nausea, and vomiting. She has been taking Ibuprofen for management of symptoms. The remainder of the review of systems is negative based on the transcript provided.  No past surgical history on file.  Outpatient Medications Prior to Visit  Medication Sig Dispense Refill   acetaminophen (TYLENOL) 500 MG tablet Take 1,000 mg by mouth every 6 (six) hours as needed for headache.     amitriptyline (ELAVIL) 50 MG tablet Take 1 tablet (50 mg total) by mouth at bedtime. 30 tablet 1   Cholecalciferol (VITAMIN D) 125 MCG (5000 UT) CAPS Take 1 capsule by mouth daily in the afternoon.     ibuprofen (ADVIL) 200 MG tablet Take 600-800 mg by mouth every 6 (six) hours as needed for headache.     Multiple Vitamins-Minerals (MULTI-VITAMIN GUMMIES) CHEW Chew 2 each by mouth daily in the afternoon.     ondansetron (ZOFRAN) 4 MG tablet Take 1 tablet (4 mg total) by mouth every 8 (eight) hours as needed for nausea or vomiting. 30 tablet 1   No facility-administered medications prior to visit.    Family History  Problem Relation Age of Onset   Alcohol abuse Father    Depression Sister    Depression Sister    Hyperlipidemia Maternal Grandmother    Diabetes Maternal Grandmother    Hypertension Maternal Grandfather    Hypertension Paternal Grandmother    Diabetes Paternal Grandmother    Kidney disease Paternal Grandmother    Breast cancer Neg Hx    Colon cancer Neg Hx  Social History   Socioeconomic History   Marital status: Married    Spouse name: Not on file   Number of children: Not on file   Years of education: Not on file   Highest education level: Not on file  Occupational History   Not on file  Tobacco Use   Smoking status: Never    Passive exposure: Never   Smokeless tobacco: Never  Vaping Use   Vaping Use: Former   Quit date: 05/22/2020  Substance  and Sexual Activity   Alcohol use: Yes    Alcohol/week: 2.0 - 3.0 standard drinks of alcohol    Types: 2 - 3 Glasses of wine per week   Drug use: Never   Sexual activity: Yes    Partners: Male    Birth control/protection: None    Comment: first intercourse= 24, partners= 2  Other Topics Concern   Not on file  Social History Narrative   Getting married   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   Graduating soon with Masters   Social Determinants of Radio broadcast assistant Strain: Not on file  Food Insecurity: Not on file  Transportation Needs: Not on file  Physical Activity: Not on file  Stress: Not on file  Social Connections: Not on file  Intimate Partner Violence: Not on file                                                                                                 Objective:  Physical Exam: BP 124/78 (BP Location: Left Arm, Patient Position: Sitting, Cuff Size: Large)   Pulse (!) 108   Temp 97.7 F (36.5 C) (Temporal)   Wt 274 lb 9.6 oz (124.6 kg)   SpO2 94%   BMI 44.32 kg/m    General: No acute distress. Awake and conversant.  Eyes: Normal conjunctiva, anicteric. Round symmetric pupils.  ENT: Hearing grossly intact. No nasal discharge.  Neck: Neck is supple. No masses or thyromegaly.  Respiratory: Respirations are non-labored. No auditory wheezing.  Skin: Warm. No rashes or ulcers.  Psych: Alert and oriented. Cooperative, Appropriate mood and affect, Normal judgment.  CV: No cyanosis or JVD MSK: Normal ambulation. No clubbing  Neuro: Sensation and CN II-XII grossly normal.   Results for orders placed or performed in visit on 06/13/22  POCT rapid strep A  Result Value Ref Range   Rapid Strep A Screen Positive (A) Negative        Alesia Banda, MD, MS

## 2022-07-17 ENCOUNTER — Encounter: Payer: Self-pay | Admitting: Physician Assistant

## 2022-07-18 ENCOUNTER — Ambulatory Visit: Payer: BC Managed Care – PPO | Admitting: Physician Assistant

## 2022-07-18 ENCOUNTER — Encounter: Payer: Self-pay | Admitting: Physician Assistant

## 2022-07-18 VITALS — BP 110/70 | HR 89 | Temp 97.5°F | Ht 66.0 in | Wt 268.0 lb

## 2022-07-18 DIAGNOSIS — G43009 Migraine without aura, not intractable, without status migrainosus: Secondary | ICD-10-CM | POA: Diagnosis not present

## 2022-07-18 DIAGNOSIS — F320 Major depressive disorder, single episode, mild: Secondary | ICD-10-CM

## 2022-07-18 DIAGNOSIS — Z3201 Encounter for pregnancy test, result positive: Secondary | ICD-10-CM

## 2022-07-18 DIAGNOSIS — Z3A01 Less than 8 weeks gestation of pregnancy: Secondary | ICD-10-CM | POA: Diagnosis not present

## 2022-07-18 DIAGNOSIS — F411 Generalized anxiety disorder: Secondary | ICD-10-CM

## 2022-07-18 NOTE — Progress Notes (Signed)
Yvonne Odom is a 27 y.o. female here for a new problem.  History of Present Illness:   Chief Complaint  Patient presents with   Anxiety    Needs to discuss changing medication due to pregnant.    HPI  Patient is currently 5 weeks and 4 days pregnant Her OB/GYN is at [facility] She is scheduled for her initial ultrasound on March 18  Anxiety and depression She is currently on amitriptyline 25 mg daily for headaches and her mental health She was on 50 mg daily up until yesterday but message as due to her pregnancy status We also decreased to 25 mg daily and follow-up in office to discuss a plan to come off this medication She does have a history of being on Zoloft and felt like it was not super effective Denies having any worsening symptoms Denies current suicidal or homicidal ideation  Migraine Patient is having periodic headaches and taking ibuprofen and Tylenol as needed Denies any severe pain, changes in vision,  Past Medical History:  Diagnosis Date   Allergy    Anxiety    Depression      Social History   Tobacco Use   Smoking status: Never    Passive exposure: Never   Smokeless tobacco: Never  Vaping Use   Vaping Use: Former   Quit date: 05/22/2020  Substance Use Topics   Alcohol use: Yes    Alcohol/week: 2.0 - 3.0 standard drinks of alcohol    Types: 2 - 3 Glasses of wine per week   Drug use: Never    History reviewed. No pertinent surgical history.  Family History  Problem Relation Age of Onset   Alcohol abuse Father    Depression Sister    Depression Sister    Hyperlipidemia Maternal Grandmother    Diabetes Maternal Grandmother    Hypertension Maternal Grandfather    Hypertension Paternal Grandmother    Diabetes Paternal Grandmother    Kidney disease Paternal Grandmother    Breast cancer Neg Hx    Colon cancer Neg Hx     No Known Allergies  Current Medications:   Current Outpatient Medications:    acetaminophen  (TYLENOL) 500 MG tablet, Take 1,000 mg by mouth every 6 (six) hours as needed for headache., Disp: , Rfl:    amitriptyline (ELAVIL) 50 MG tablet, Take 1 tablet (50 mg total) by mouth at bedtime., Disp: 30 tablet, Rfl: 1   Cholecalciferol (VITAMIN D) 125 MCG (5000 UT) CAPS, Take 1 capsule by mouth daily in the afternoon., Disp: , Rfl:    ibuprofen (ADVIL) 200 MG tablet, Take 600-800 mg by mouth every 6 (six) hours as needed for headache., Disp: , Rfl:    Multiple Vitamins-Minerals (MULTI-VITAMIN GUMMIES) CHEW, Chew 2 each by mouth daily in the afternoon., Disp: , Rfl:    ondansetron (ZOFRAN) 4 MG tablet, Take 1 tablet (4 mg total) by mouth every 8 (eight) hours as needed for nausea or vomiting., Disp: 30 tablet, Rfl: 1   Prenatal Vit-Fe Fumarate-FA (PRENATAL VITAMIN PO), Take 1 tablet by mouth daily in the afternoon., Disp: , Rfl:    Review of Systems:   ROS Negative unless otherwise specified per HPI.  Vitals:   Vitals:   07/18/22 1452  BP: 110/70  Pulse: 89  Temp: (!) 97.5 F (36.4 C)  TempSrc: Temporal  SpO2: 98%  Weight: 268 lb (121.6 kg)  Height: '5\' 6"'$  (1.676 m)     Body mass index is 43.26 kg/m.  Physical Exam:   Physical Exam Vitals and nursing note reviewed.  Constitutional:      General: She is not in acute distress.    Appearance: She is well-developed. She is not ill-appearing or toxic-appearing.  Cardiovascular:     Rate and Rhythm: Normal rate and regular rhythm.     Pulses: Normal pulses.     Heart sounds: Normal heart sounds, S1 normal and S2 normal.  Pulmonary:     Effort: Pulmonary effort is normal.     Breath sounds: Normal breath sounds.  Skin:    General: Skin is warm and dry.  Neurological:     Mental Status: She is alert.     GCS: GCS eye subscore is 4. GCS verbal subscore is 5. GCS motor subscore is 6.  Psychiatric:        Speech: Speech normal.        Behavior: Behavior normal. Behavior is cooperative.     Assessment and Plan:   Less than  [redacted] weeks gestation of pregnancy She is currently on prenatal and set up with OB Provided list of pregnancy safe over-the-counter medications  Migraine without aura and without status migrainosus, not intractable Will wean Elavil to 25 mg daily x 1 week and then asked her to stop this medication may take Tylenol as needed for breakthrough symptoms If any new or worsening symptoms, reach out as May need to get neuro involved if symptoms are not controlled  GAD (generalized anxiety disorder); Depression, major, single episode, mild (HCC) Overall controlled Will wean Elavil to 25 mg daily x 1 week and then asked her to stop this medication  I have asked her to follow-up with me in 2 weeks virtually so we can check in on her mental health, may end up starting an SSRI that is pregnancy safe such as Zoloft I discussed with patient that if they develop any SI, to tell someone immediately and seek medical attention. She declines talk therapy at this time  Inda Coke, [place]

## 2022-07-18 NOTE — Patient Instructions (Signed)
It was great to see you!  Decrease elavil to 25 mg daily x 1 week, then stop! Message me if concerns  Follow-up in two weeks virtually to discuss mental health - we may need to add zoloft in.  If you develop suicidal thoughts, please tell someone and immediately proceed to our local 24/7 crisis center, [facility] at the [facility]. [address] [place], [place] [address] [phone].    Take care,  Inda Coke PA-C

## 2022-07-28 NOTE — H&P (Signed)
HPI:   Yvonne Odom is a 27 y.o. female who presents as a new Patient.   Referring Provider: Self, A Referral  Chief complaint: Tonsillitis.  HPI: History of tonsillitis frequently when she was young. She did then did well for many years until about this past year when she has had 3 episodes of strep tonsillitis and 1 or 2 episodes of nonstrep. 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 Otherwise in good health.  PMH/Meds/All/SocHx/FamHx/ROS:   Past Medical History:  Diagnosis Date   Strep throat   History reviewed. No pertinent surgical history.  No family history of bleeding disorders, wound healing problems or difficulty with anesthesia.   Social History   Socioeconomic History   Marital status: Single  Spouse name: Not on file   Number of children: Not on file   Years of education: Not on file   Highest education level: Not on file  Occupational History   Not on file  Tobacco Use   Smoking status: Never Smoker   Smokeless tobacco: Never Used  Substance and Sexual Activity   Alcohol use: Yes   Drug use: Never   Sexual activity: Not on file  Other Topics Concern   Not on file  Social History Narrative   Not on file   Social Determinants of Health   Financial Resource Strain: Not on file  Food Insecurity: Not on file  Transportation Needs: Not on file  Physical Activity: Not on file  Stress: Not on file  Social Connections: Not on file  Housing Stability: Not on file   Current Outpatient Medications:   sertraline (ZOLOFT) 100 MG tablet, Take 100 mg by mouth., Disp: , Rfl:   A complete ROS was performed with pertinent positives/negatives noted in the HPI. The remainder of the ROS are negative.   Physical Exam:   BP 123/73 (Site: Left arm, Position: Sitting)  Pulse 75  Temp 97.8 F (36.6 C)  Ht 1.702 m ('5\' 7"'$ )  Wt 102.1 kg (225 lb)  BMI 35.24 kg/m   General: Healthy and  alert, in no distress, breathing easily. Normal affect. In a pleasant mood. Head: Normocephalic, atraumatic. No masses, or scars. Eyes: Pupils are equal, and reactive to light. Vision is grossly intact. No spontaneous or gaze nystagmus. Ears: Ear canals are clear. Tympanic membranes are intact, with normal landmarks and the middle ears are clear and healthy. Hearing: Grossly normal. Nose: Nasal cavities are clear with healthy mucosa, no polyps or exudate. Airways are patent. Face: No masses or scars, facial nerve function is symmetric. Oral Cavity: No mucosal abnormalities are noted. Tongue with normal mobility. Dentition appears healthy. Oropharynx: Tonsils are symmetric, 2+ enlarged. There are no mucosal masses identified. Tongue base appears normal and healthy. Larynx/Hypopharynx: deferred Chest: Deferred Neck: No palpable masses, no cervical adenopathy, no thyroid nodules or enlargement. Neuro: Cranial nerves II-XII with normal function. Balance: Normal gate. Other findings: none.  Independent Review of Additional Tests or Records:  none  Procedures:  none  Impression & Plans:  Recurrent strep and nonstrep tonsillitis. Consider tonsillectomy if this pattern continues in the future.[place] meets the indications for tonsillectomy. Risks and benefits were discussed in detail. All questions were answered. A handout was provided with additional details.

## 2022-08-03 NOTE — Progress Notes (Addendum)
I spoke with Yvonne Odom, patient states she has called Dr. Janeice Robinson office on Wednesday and today and left messages with scheduler, she has not received a call back.  Patient states she is going to cancelling surgery, because she is pregnant. I encouraged patient to call again tomorrow. I left a message on Dr. Janeice Robinson scheduler's voice mail.

## 2022-08-04 ENCOUNTER — Telehealth (INDEPENDENT_AMBULATORY_CARE_PROVIDER_SITE_OTHER): Payer: BC Managed Care – PPO | Admitting: Physician Assistant

## 2022-08-04 ENCOUNTER — Encounter: Payer: Self-pay | Admitting: Physician Assistant

## 2022-08-04 VITALS — Ht 66.0 in | Wt 265.0 lb

## 2022-08-04 DIAGNOSIS — F320 Major depressive disorder, single episode, mild: Secondary | ICD-10-CM | POA: Diagnosis not present

## 2022-08-04 DIAGNOSIS — Z3A08 8 weeks gestation of pregnancy: Secondary | ICD-10-CM | POA: Diagnosis not present

## 2022-08-04 DIAGNOSIS — F411 Generalized anxiety disorder: Secondary | ICD-10-CM

## 2022-08-04 MED ORDER — SERTRALINE HCL 25 MG PO TABS: 25.0000 mg | ORAL_TABLET | Freq: Every day | ORAL | 3 refills | Status: DC

## 2022-08-04 NOTE — Progress Notes (Signed)
   I acted as a Education administrator for [employer], PA-C Anselmo Pickler, LPN  Virtual Visit via Video Note   I, Inda Coke, PA, connected with  Yvonne Odom  ([id number], 12/06/1995) on 08/04/22 at  2:00 PM EDT by a video-enabled telemedicine application and verified that I am speaking with the correct person using two identifiers.  Location: Patient: Home Provider: [place] office   I discussed the limitations of evaluation and management by telemedicine and the availability of in person appointments. The patient expressed understanding and agreed to proceed.    History of Present Illness: Yvonne Odom is a 27 y.o. who identifies as a female who was assigned female at birth, and is being seen today for anxiety / depression. Pt has been off amitriptyline x 1 week. Discuss starting Zoloft medication. Pt is [redacted] weeks pregnant. Denies concerns stopping amitriptyline. Has some ongoing depression and anxiety. Work is stressful and she has an upcoming first appt with gyn on Monday.  Denies SI/HI.  Has good support system.  Problems:  Patient Active Problem List   Diagnosis Date Noted   GAD (generalized anxiety disorder) 09/22/2021   Depression, major, single episode, mild ([place]) 09/22/2021   Vitamin D deficiency 09/22/2021    Allergies: No Known Allergies Medications:  Current Outpatient Medications:    acetaminophen (TYLENOL) 500 MG tablet, Take 1,000 mg by mouth every 6 (six) hours as needed for headache., Disp: , Rfl:    Prenatal Vit-Fe Fumarate-FA (PRENATAL VITAMIN PO), Take 1 tablet by mouth daily in the afternoon., Disp: , Rfl:    pyridOXINE (VITAMIN B6) 25 MG tablet, Take 25 mg by mouth daily., Disp: , Rfl:    sertraline (ZOLOFT) 25 MG tablet, Take 1 tablet (25 mg total) by mouth daily., Disp: 30 tablet, Rfl: 3  Observations/Objective: Patient is well-developed, well-nourished in no acute distress.  Resting comfortably  at home.  Head is normocephalic,  atraumatic.  No labored breathing.  Speech is clear and coherent with logical content.  Patient is alert and oriented at baseline.    Assessment and Plan: GAD (generalized anxiety disorder); Depression, major, single episode, mild (HCC); [redacted] weeks gestation of pregnancy Ongoing No red flags Start zoloft 25 mg daily Follow-up in 3 months, sooner if concerns May also have ob take over if needed I discussed with patient that if they develop any SI, to tell someone immediately and seek medical attention.   Follow Up Instructions: I discussed the assessment and treatment plan with the patient. The patient was provided an opportunity to ask questions and all were answered. The patient agreed with the plan and demonstrated an understanding of the instructions.  A copy of instructions were sent to the patient via MyChart unless otherwise noted below.   The patient was advised to call back or seek an in-person evaluation if the symptoms worsen or if the condition fails to improve as anticipated.  Inda Coke, [place]

## 2022-08-07 ENCOUNTER — Encounter: Payer: Self-pay | Admitting: Nurse Practitioner

## 2022-08-07 ENCOUNTER — Ambulatory Visit (HOSPITAL_COMMUNITY): Admission: RE | Admit: 2022-08-07 | Payer: BC Managed Care – PPO | Source: Home / Self Care | Admitting: Otolaryngology

## 2022-08-07 ENCOUNTER — Ambulatory Visit: Payer: BC Managed Care – PPO | Admitting: Nurse Practitioner

## 2022-08-07 VITALS — BP 128/76 | HR 88 | Wt 272.0 lb

## 2022-08-07 DIAGNOSIS — Z3201 Encounter for pregnancy test, result positive: Secondary | ICD-10-CM

## 2022-08-07 DIAGNOSIS — N926 Irregular menstruation, unspecified: Secondary | ICD-10-CM | POA: Diagnosis not present

## 2022-08-07 DIAGNOSIS — Z3A08 8 weeks gestation of pregnancy: Secondary | ICD-10-CM

## 2022-08-07 LAB — PREGNANCY, URINE: Preg Test, Ur: POSITIVE — AB

## 2022-08-07 SURGERY — TONSILLECTOMY
Anesthesia: General | Laterality: Bilateral

## 2022-08-07 NOTE — Progress Notes (Signed)
   Acute Office Visit  Subjective:    Patient ID: Yvonne Odom, female    DOB: Aug 07, 1995, 27 y.o.   MRN: [id number]   HPI 27 y.o. G1P0000 presents today for pregnancy confirmation. LMP 06/08/2022. Saw PCP recently and had anxiety medications changed due to pregnancy. Has not started Zoloft. Has had some nausea and insomnia. Taking B6 and Unisom. Taking PNV. Husband present during visit.    Review of Systems  Constitutional: Negative.   Gastrointestinal:  Positive for nausea. Negative for abdominal pain and vomiting.  Genitourinary:  Negative for vaginal bleeding.  Psychiatric/Behavioral:  Positive for sleep disturbance.        Objective:    Physical Exam Constitutional:      Appearance: Normal appearance.   GU: not indicated  BP 128/76   Pulse 88   Wt 272 lb (123.4 kg)   LMP 06/08/2022 (Exact Date) Comment: GA: 8 weeks  SpO2 100%   BMI 43.90 kg/m  Wt Readings from Last 3 Encounters:  08/07/22 272 lb (123.4 kg)  08/04/22 265 lb (120.2 kg)  07/18/22 268 lb (121.6 kg)       UPT +  Assessment & Plan:   Problem List Items Addressed This Visit   None Visit Diagnoses     [redacted] weeks gestation of pregnancy    -  Primary   Missed menses       Relevant Orders   Pregnancy, urine      Plan: UPT positive in office today. Discussed pregnancy-safe behaviors, PNV, and warning signs. List of local OB providers given to patient and recommend calling to establish now. All questions answered.      Tamela Gammon DNP, 8:49 AM 08/07/2022

## 2022-08-10 ENCOUNTER — Encounter: Payer: Self-pay | Admitting: Physician Assistant

## 2022-08-10 ENCOUNTER — Ambulatory Visit: Payer: BC Managed Care – PPO | Admitting: Physician Assistant

## 2022-08-10 VITALS — BP 110/76 | HR 69 | Temp 97.8°F | Ht 66.0 in | Wt 270.4 lb

## 2022-08-10 DIAGNOSIS — Z3A08 8 weeks gestation of pregnancy: Secondary | ICD-10-CM | POA: Diagnosis not present

## 2022-08-10 DIAGNOSIS — F320 Major depressive disorder, single episode, mild: Secondary | ICD-10-CM | POA: Diagnosis not present

## 2022-08-10 NOTE — Progress Notes (Signed)
Yvonne Odom is a 27 y.o. female here for a new problem.  History of Present Illness:   Chief Complaint  Patient presents with  . Referal to OB    Pt needs a referral to an Obstetrician and blood pregnancy test.    HPI  8 weeks pregnancy She recently had a visit and confirmed positive pregnancy test.  She follows up with Marny Lowenstein, NP for gynecology.  She called a local ob and was told she needed an u/s or blood test to prove pregnancy in order to be seen. She has already had her first ultrasound and reports it was normal.  She is interested in establishing care with an OBGYN in [place].   Depression She has not started Zoloft yet.  She is waiting to start until after following up with her OB.  Past Medical History:  Diagnosis Date  . Allergy   . Anxiety   . Depression      Social History   Tobacco Use  . Smoking status: Never    Passive exposure: Never  . Smokeless tobacco: Never  Vaping Use  . Vaping Use: Former  . Quit date: 05/22/2020  Substance Use Topics  . Alcohol use: Yes    Alcohol/week: 2.0 - 3.0 standard drinks of alcohol    Types: 2 - 3 Glasses of wine per week  . Drug use: Never    History reviewed. No pertinent surgical history.  Family History  Problem Relation Age of Onset  . Alcohol abuse Father   . Depression Sister   . Depression Sister   . Hyperlipidemia Maternal Grandmother   . Diabetes Maternal Grandmother   . Hypertension Maternal Grandfather   . Hypertension Paternal Grandmother   . Diabetes Paternal Grandmother   . Kidney disease Paternal Grandmother   . Breast cancer Neg Hx   . Colon cancer Neg Hx     No Known Allergies  Current Medications:   Current Outpatient Medications:  .  acetaminophen (TYLENOL) 500 MG tablet, Take 1,000 mg by mouth every 6 (six) hours as needed for headache., Disp: , Rfl:  .  Prenatal Vit-Fe Fumarate-FA (PRENATAL VITAMIN PO), Take 1 tablet by mouth daily in the afternoon., Disp: , Rfl:   .  pyridOXINE (VITAMIN B6) 25 MG tablet, Take 25 mg by mouth daily., Disp: , Rfl:  .  sertraline (ZOLOFT) 25 MG tablet, Take 1 tablet (25 mg total) by mouth daily., Disp: 30 tablet, Rfl: 3   Review of Systems:   ROS Negative unless otherwise specified per HPI.  Vitals:   Vitals:   08/10/22 0934  BP: 110/76  Pulse: 69  Temp: 97.8 F (36.6 C)  TempSrc: Temporal  SpO2: 99%  Weight: 270 lb 6.1 oz (122.6 kg)  Height: 5\' 6"  (1.676 m)     Body mass index is 43.64 kg/m.  Physical Exam:   Physical Exam Vitals and nursing note reviewed.  Constitutional:      General: She is not in acute distress.    Appearance: She is well-developed. She is not ill-appearing or toxic-appearing.  Cardiovascular:     Rate and Rhythm: Normal rate and regular rhythm.     Pulses: Normal pulses.     Heart sounds: Normal heart sounds, S1 normal and S2 normal.  Pulmonary:     Effort: Pulmonary effort is normal.     Breath sounds: Normal breath sounds.  Skin:    General: Skin is warm and dry.  Neurological:     Mental  Status: She is alert.     GCS: GCS eye subscore is 4. GCS verbal subscore is 5. GCS motor subscore is 6.  Psychiatric:        Speech: Speech normal.        Behavior: Behavior normal. Behavior is cooperative.     Assessment and Plan:   [redacted] weeks gestation of pregnancy Referral to [place] Patient is also planning to call  Depression, major, single episode, mild ([place]) Overall controlled She has zoloft and will start if/when ready Denies SI/HI  I,Rachel Rivera,acting as a Education administrator for [employer], PA.,have documented all relevant documentation on the behalf of Inda Coke, PA,as directed by  Inda Coke, PA while in the presence of Inda Coke, [place].   I, Inda Coke, [place], have reviewed all documentation for this visit. The documentation on 08/10/22 for the exam, diagnosis, procedures, and orders are all accurate and complete.  Inda Coke, PA-C

## 2022-08-10 NOTE — Patient Instructions (Signed)
It was great to see you!  I recommend pursuing consultation with [facility] -- please call them!  Take care,  Inda Coke PA-C

## 2022-10-03 LAB — OB RESULTS CONSOLE HEPATITIS B SURFACE ANTIGEN: Hepatitis B Surface Ag: NEGATIVE

## 2022-10-03 LAB — OB RESULTS CONSOLE RUBELLA ANTIBODY, IGM: Rubella: IMMUNE

## 2022-10-03 LAB — OB RESULTS CONSOLE HIV ANTIBODY (ROUTINE TESTING): HIV: NONREACTIVE

## 2022-10-03 LAB — HEPATITIS C ANTIBODY: HCV Ab: NEGATIVE

## 2022-10-03 LAB — OB RESULTS CONSOLE GC/CHLAMYDIA
Chlamydia: NEGATIVE
Neisseria Gonorrhea: NEGATIVE

## 2022-10-03 LAB — OB RESULTS CONSOLE RPR: RPR: NONREACTIVE

## 2023-02-27 LAB — OB RESULTS CONSOLE GBS: GBS: NEGATIVE

## 2023-03-26 ENCOUNTER — Telehealth (HOSPITAL_COMMUNITY): Payer: Self-pay | Admitting: *Deleted

## 2023-03-26 ENCOUNTER — Encounter (HOSPITAL_COMMUNITY): Payer: Self-pay | Admitting: *Deleted

## 2023-03-26 NOTE — Telephone Encounter (Signed)
Preadmission screen  

## 2023-03-27 ENCOUNTER — Encounter (HOSPITAL_COMMUNITY): Payer: Self-pay

## 2023-03-28 ENCOUNTER — Telehealth (HOSPITAL_COMMUNITY): Payer: Self-pay | Admitting: *Deleted

## 2023-03-28 NOTE — Telephone Encounter (Signed)
Preadmission screen  

## 2023-04-02 ENCOUNTER — Inpatient Hospital Stay (HOSPITAL_COMMUNITY)
Admission: RE | Admit: 2023-04-02 | Payer: BC Managed Care – PPO | Source: Home / Self Care | Admitting: Obstetrics and Gynecology

## 2023-04-02 ENCOUNTER — Inpatient Hospital Stay (HOSPITAL_COMMUNITY): Payer: BC Managed Care – PPO

## 2024-05-06 ENCOUNTER — Ambulatory Visit: Payer: Self-pay

## 2024-05-06 NOTE — Telephone Encounter (Signed)
 FYI Only or Action Required?: FYI only for provider: appointment scheduled on 12/17.  Patient was last seen in primary care on 08/10/2022 by Job Lukes, PA.  Called Nurse Triage reporting URI.  Symptoms began several days ago.  Interventions attempted: OTC medications: dayquil, ibuprofen.  Symptoms are: gradually worsening.  Triage Disposition: See Physician Within 24 Hours  Patient/caregiver understands and will follow disposition?: Yes  Message from Zy'onna H sent at 05/06/2024  8:09 AM EST  Reason for Triage: Patient called in stating she has had a cold for about 1 week. The most prominent symptoms are:   - Sore & Scratchy throat - Fluid in ear- left. ear  **Transf to. NT**   Reason for Disposition  Earache  Answer Assessment - Initial Assessment Questions Cold symptoms for about a week- sore throat, nasal congestion/ runny, productive cough. Pressure in her ear for 3-4 days- fluid  Throat scratchy and sore- able to manage with ibuprofen- swollen maybe a tonsil stone.  Frequent strep- usually unable to manage with ibuprofen. Denies seeing any patchy spots  Dayquil- and Ibuprofen help everything else except her ear Had not tried warm compress  Appt with Pcp office 12/17 to assess. Will go to UC/ED if worsens.  1. ONSET: When did the nasal discharge start?      One week  2. AMOUNT: How much discharge is there?      Very congested- mild moderate discharge- green yellow 3. COUGH: Do you have a cough? If Yes, ask: Describe the color of your mucus. (e.g., clear, white, yellow, green)     Mild cough  4. RESPIRATORY DISTRESS: Describe your breathing.      Denies  5. FEVER: Do you have a fever? If Yes, ask: What is your temperature, how was it measured, and when did it start?     Denies  6. SEVERITY: Overall, how bad are you feeling right now? (e.g., doesn't interfere with normal activities, staying home from school/work, staying in bed)      Body aches  and chills early on.  7. OTHER SYMPTOMS: Do you have any other symptoms? (e.g., earache, mouth sores, sore throat, wheezing)     Left ear ache- full/congested, feels like fluid and wont pop 8. PREGNANCY: Is there any chance you are pregnant? When was your last menstrual period?     Denies  Protocols used: Common Cold-A-AH

## 2024-05-07 ENCOUNTER — Ambulatory Visit: Payer: Self-pay | Admitting: Family Medicine

## 2024-05-07 ENCOUNTER — Other Ambulatory Visit: Payer: Self-pay | Admitting: Family Medicine

## 2024-05-07 ENCOUNTER — Encounter: Payer: Self-pay | Admitting: Family Medicine

## 2024-05-07 VITALS — BP 114/72 | HR 80 | Temp 97.3°F | Ht 66.0 in | Wt 290.4 lb

## 2024-05-07 DIAGNOSIS — H6501 Acute serous otitis media, right ear: Secondary | ICD-10-CM

## 2024-05-07 MED ORDER — AMOXICILLIN 500 MG PO CAPS: 500.0000 mg | ORAL_CAPSULE | Freq: Three times a day (TID) | ORAL | 0 refills | Status: AC

## 2024-05-07 NOTE — Patient Instructions (Signed)
 Happy Holidays!!!!

## 2024-05-07 NOTE — Progress Notes (Signed)
 Subjective:     Patient ID: Yvonne Odom, female    DOB: [birth year], 28 y.o.   MRN: [id number]  Chief Complaint  Patient presents with   Otalgia    Pt complains with ears hurting and also her throat x5 days    Discussed the use of AI scribe software for clinical note transcription with the patient, who gave verbal consent to proceed.  History of Present Illness A 28 year old female who presents with ear discomfort and congestion.  She has experienced right ear fullness and decreased hearing from the L for the past four days, with mild pain beginning yesterday on R or the day before.  She has symptoms consistent with a cold, including congestion, runny nose, and sore throat. No significant fever, vomiting, or diarrhea. She has a mild cough but no shortness of breath.  She is currently taking sertraline  and has stopped taking prenatal vitamins. She is not using any form of birth control and has no medication allergies. LMP due    Health Maintenance Due  Topic Date Due   Influenza Vaccine  12/21/2023    Past Medical History:  Diagnosis Date   Allergy    Anxiety    Depression     Past Surgical History:  Procedure Laterality Date   CESAREAN SECTION  03/12/2023    Current Medications[1]  Allergies[2] ROS neg/noncontributory except as noted HPI/below      Objective:     BP 114/72 (BP Location: Left Arm, Patient Position: Sitting, Cuff Size: Large)   Pulse 80   Temp (!) 97.3 F (36.3 C) (Temporal)   Ht 5' 6 (1.676 m)   Wt 290 lb 6 oz (131.7 kg)   LMP 04/05/2024 (Exact Date)   SpO2 97%   Breastfeeding No   BMI 46.87 kg/m  Wt Readings from Last 3 Encounters:  05/07/24 290 lb 6 oz (131.7 kg)  08/10/22 270 lb 6.1 oz (122.6 kg)  08/07/22 272 lb (123.4 kg)    Physical Exam GENERAL: Well developed, well nourished, no acute distress. HEAD EYES EARS NOSE THROAT: Normocephalic, atraumatic, conjunctiva not injected, sclera nonicteric. Fluid behind right  eardrum, pink. Left eardrum dull, retracted, no fluid. Throat pink, no exudates. CARDIAC: Regular rate and rhythm, S1 S2 present, no murmur,  NECK: Supple, no thyromegaly, no cervical lymphadenopathy. LUNGS: Clear to auscultation bilaterally, no wheezes. EXTREMITIES: No edema. MUSCULOSKELETAL: No gross abnormalities. NEUROLOGICAL: Alert and oriented x3, cranial nerves II through XII intact. PSYCHIATRIC: Normal mood, good eye contact.       Assessment & Plan:  Non-recurrent acute serous otitis media of right ear  Other orders -     Amoxicillin ; Take 1 capsule (500 mg total) by mouth 3 (three) times daily for 10 days.  Dispense: 30 capsule; Refill: 0 -     Fluticasone  Propionate; Place 2 sprays into both nostrils daily.  Dispense: 16 g; Refill: 0    Assessment and Plan Assessment & Plan Acute right otitis media with upper respiratory infection   She has acute right otitis media with fluid behind the right eardrum, pink in color, indicating possible infection. The left ear is dull and retracted, likely due to cold symptoms. She presents with upper respiratory infection symptoms including congestion, runny nose, sore throat, and mild cough, but no fever, vomiting, or diarrhea. Symptoms have persisted for four days, with recent onset of right ear pain. Prescribe amoxicillin  for the ear infection. Recommend over-the-counter Flonase  for nasal swelling.  Major depressive disorder  Her major depressive disorder is currently managed with sertraline . Continue sertraline  for depression management.     Return for annual physical-w/Sam.  Jenkins CHRISTELLA Carrel, MD     [1]  Current Outpatient Medications:    acetaminophen  (TYLENOL ) 500 MG tablet, Take 1,000 mg by mouth every 6 (six) hours as needed for headache., Disp: , Rfl:    amoxicillin  (AMOXIL ) 500 MG capsule, Take 1 capsule (500 mg total) by mouth 3 (three) times daily for 10 days., Disp: 30 capsule, Rfl: 0   fluticasone  (FLONASE ) 50 MCG/ACT  nasal spray, Place 2 sprays into both nostrils daily., Disp: 16 g, Rfl: 0   pyridOXINE (VITAMIN B6) 25 MG tablet, Take 25 mg by mouth daily., Disp: , Rfl:    sertraline  (ZOLOFT ) 25 MG tablet, Take 1 tablet (25 mg total) by mouth daily., Disp: 30 tablet, Rfl: 3 [2] No Known Allergies

## 2024-06-19 ENCOUNTER — Telehealth: Admitting: Emergency Medicine

## 2024-06-19 DIAGNOSIS — H9201 Otalgia, right ear: Secondary | ICD-10-CM

## 2024-06-19 MED ORDER — AMOXICILLIN 875 MG PO TABS: 875.0000 mg | ORAL_TABLET | Freq: Two times a day (BID) | ORAL | 0 refills | Status: AC

## 2024-06-19 NOTE — Progress Notes (Signed)
 "  E-Visit for Ear Pain - Acute Otitis Media   We are sorry that you are not feeling well. Here is how we plan to help!  Based on what you have shared with me it looks like you have Acute Otitis Media.  Acute Otitis Media is an infection of the middle or inner ear. This type of infection can cause redness, inflammation, and fluid buildup behind the tympanic membrane (ear drum).  The usual symptoms include: Earache/Pain Fever Upper respiratory symptoms Lack of energy/Fatigue/Malaise Slight hearing loss gradually worsening- if the inner ear fills with fluid What causes middle ear infections? Most middle ear infections occur when an infection such as a cold, leads to a build-up of mucus in the middle ear and causes the Eustachian tube (a thin tube that runs from the middle ear to the back of the nose) to become swollen or blocked.   This means mucus can't drain away properly, making it easier for an infection to spread into the middle ear.  How middle ear infections are treated: Most ear infections clear up within three to five days and don't need any specific treatment. If necessary, tylenol  or ibuprofen should be used to relieve pain and a high temperature.  If you develop a fever higher than 102, or any significantly worsening symptoms, this could indicate a more serious infection moving to the middle/inner and needs face to face evaluation in an office by a provider.   Antibiotics aren't routinely used to treat middle ear infections, although they may occasionally be prescribed if symptoms persist or are particularly severe. Given your presentation,   I have prescribed Amoxicillin  875 mg one tablet twice daily for 10 days     Your symptoms should improve over the next 3 days and should resolve in about 7 days. Be sure to complete ALL of the prescription(s) given.  HOME CARE: Wash your hands frequently. If you are prescribed an ear drop, do not place the tip of the bottle on your ear or  touch it with your fingers. You can take Acetaminophen  650 mg every 4-6 hours as needed for pain.  If pain is severe or moderate, you can apply a heating pad (set on low) or hot water bottle (wrapped in a towel) to outer ear for 20 minutes.  This will also increase drainage.  GET HELP RIGHT AWAY IF: Fever is over 102.2 degrees. You develop progressive ear pain or hearing loss. Ear symptoms persist longer than 3 days after treatment.  MAKE SURE YOU: Understand these instructions. Will watch your condition. Will get help right away if you are not doing well or get worse.  Thank you for choosing an e-visit.  Your e-visit answers were reviewed by a board certified advanced clinical practitioner to complete your personal care plan. Depending upon the condition, your plan could have included both over the counter or prescription medications.  Please review your pharmacy choice. Make sure the pharmacy is open so you can pick up the prescription now. If there is a problem, you may contact your provider through [employer] and have the prescription routed to another pharmacy.  Your safety is important to us . If you have drug allergies check your prescription carefully.   For the next 24 hours you can use MyChart to ask questions about today's visit, request a non-urgent call back, or ask for a work or school excuse. You will get an email with a survey after your eVisit asking about your experience. We would appreciate  your feedback. I hope that your e-visit has been valuable and will aid in your recovery.  I have spent 6 minutes in review of e-visit questionnaire, review and updating patient chart, medical decision making and response to patient.   Lamar Schlossman, PA-C      "

## 2024-06-20 ENCOUNTER — Encounter: Payer: Self-pay | Admitting: Physician Assistant

## 2024-06-20 ENCOUNTER — Ambulatory Visit: Admitting: Physician Assistant

## 2024-06-20 VITALS — BP 122/70 | HR 64 | Temp 97.9°F | Ht 66.0 in | Wt 288.0 lb

## 2024-06-20 DIAGNOSIS — Z0000 Encounter for general adult medical examination without abnormal findings: Secondary | ICD-10-CM

## 2024-06-20 DIAGNOSIS — H6504 Acute serous otitis media, recurrent, right ear: Secondary | ICD-10-CM | POA: Diagnosis not present

## 2024-06-20 DIAGNOSIS — G43009 Migraine without aura, not intractable, without status migrainosus: Secondary | ICD-10-CM

## 2024-06-20 DIAGNOSIS — F411 Generalized anxiety disorder: Secondary | ICD-10-CM | POA: Diagnosis not present

## 2024-06-20 DIAGNOSIS — R519 Headache, unspecified: Secondary | ICD-10-CM

## 2024-06-20 DIAGNOSIS — F320 Major depressive disorder, single episode, mild: Secondary | ICD-10-CM

## 2024-06-20 MED ORDER — AMITRIPTYLINE HCL 25 MG PO TABS: 25.0000 mg | ORAL_TABLET | Freq: Every day | ORAL | 1 refills | Status: AC

## 2024-06-20 NOTE — Patient Instructions (Signed)
 It was great to see you!  Start 25 mg amitriptyline  After 1 month(s), if you want an increase, please message me  Trial Nurtec for your next migraine If its helpful, let me know and I will order If not, we can try the Fioricet  For your lab work: You can call our office to schedule blood work here or you can walk into our other [place] lab without an appointment. If you choose to walk in, please go to [address] located in the [facility]. It is across the street from [facility]. Lab (and xray) are located in the basement.  Hours of operation are M-F 8:30am to 5:00pm. (Please note that they are closed for lunch between 12:30 and 1:00pm.)  Let's follow-up in 3 months, sooner if you have concerns.  Take care,  Lucie Buttner PA-C

## 2024-06-27 ENCOUNTER — Other Ambulatory Visit

## 2024-09-19 ENCOUNTER — Ambulatory Visit: Admitting: Physician Assistant

## 2025-06-24 ENCOUNTER — Encounter: Admitting: Physician Assistant
